# Patient Record
Sex: Male | Born: 1955
Health system: Southern US, Community
[De-identification: ages and names within clinical notes are randomized; demographics above are authoritative.]

## PROBLEM LIST (undated history)

## (undated) DIAGNOSIS — E119 Type 2 diabetes mellitus without complications: Secondary | ICD-10-CM

## (undated) DIAGNOSIS — N189 Chronic kidney disease, unspecified: Secondary | ICD-10-CM

## (undated) DIAGNOSIS — I10 Essential (primary) hypertension: Secondary | ICD-10-CM

## (undated) DIAGNOSIS — E785 Hyperlipidemia, unspecified: Secondary | ICD-10-CM

## (undated) DIAGNOSIS — Z9289 Personal history of other medical treatment: Secondary | ICD-10-CM

## (undated) HISTORY — DX: Essential (primary) hypertension: I10

## (undated) HISTORY — DX: Personal history of other medical treatment: Z92.89

## (undated) HISTORY — DX: Hyperlipidemia, unspecified: E78.5

---

## 2002-02-15 ENCOUNTER — Emergency Department (HOSPITAL_COMMUNITY): Admission: EM | Admit: 2002-02-15 | Discharge: 2002-02-15 | Payer: Self-pay

## 2005-07-11 ENCOUNTER — Encounter: Admission: RE | Admit: 2005-07-11 | Discharge: 2005-07-11 | Payer: Self-pay | Admitting: Emergency Medicine

## 2005-09-19 HISTORY — PX: INGUINAL HERNIA REPAIR: SHX194

## 2006-02-07 ENCOUNTER — Emergency Department (HOSPITAL_COMMUNITY): Admission: EM | Admit: 2006-02-07 | Discharge: 2006-02-07 | Payer: Self-pay | Admitting: Family Medicine

## 2006-04-26 ENCOUNTER — Ambulatory Visit (HOSPITAL_COMMUNITY): Admission: RE | Admit: 2006-04-26 | Discharge: 2006-04-26 | Payer: Self-pay | Admitting: General Surgery

## 2006-08-09 ENCOUNTER — Emergency Department (HOSPITAL_COMMUNITY): Admission: EM | Admit: 2006-08-09 | Discharge: 2006-08-10 | Payer: Self-pay | Admitting: Emergency Medicine

## 2007-05-07 DIAGNOSIS — Z9289 Personal history of other medical treatment: Secondary | ICD-10-CM

## 2007-05-07 HISTORY — DX: Personal history of other medical treatment: Z92.89

## 2010-07-23 ENCOUNTER — Ambulatory Visit: Payer: Self-pay | Admitting: Cardiology

## 2010-07-23 DIAGNOSIS — E785 Hyperlipidemia, unspecified: Secondary | ICD-10-CM

## 2010-07-23 HISTORY — DX: Hyperlipidemia, unspecified: E78.5

## 2011-02-04 NOTE — Op Note (Signed)
NAMEMILLER, LIMEHOUSE NO.:  [id number]   MEDICAL RECORD NO.:  [id number]          PATIENT TYPE:  AMB   LOCATION:  SDS                          FACILITY:  [facility]   PHYSICIAN:  Sharlet Salina T. Hoxworth, M.D.DATE OF BIRTH:  Aug 17, 1956   DATE OF PROCEDURE:  04/26/2006  DATE OF DISCHARGE:  04/26/2006                                 OPERATIVE REPORT   PREOPERATIVE DIAGNOSIS:  Recurrent right inguinal hernia.   POSTOPERATIVE DIAGNOSIS:  Recurrent right inguinal hernia.   SURGICAL PROCEDURES:  Repair of recurrent right inguinal hernia with mesh.   SURGEON:  Lorne Skeens. Hoxworth, M.D.   ANESTHESIA:  General laryngeal mask.   BRIEF HISTORY:  Mr. Chahal is a 55 year old male who, several years ago,  underwent a laparoscopic repair of a right inguinal hernia.  He has noted a  recurrent bulge for some time but in recent months this has gotten larger  and significantly painful with activity.  Examination reveals a chronically  incarcerated mass in the right groin extending down into the upper scrotum.  I have recommended open repair with mesh under general anesthesia.  The  nature of the procedure, indications, risks of bleeding, infection, and  recurrence were discussed and understood.  He is now brought to the  operating room for this procedure.   DESCRIPTION OF PROCEDURE:  The patient was brought to the operating room and  placed in the supine position on the operating table and general laryngeal  mask anesthesia was induced.  He received broad spectrum preoperative  antibiotics.  The right groin was sterilely prepped and draped.  The correct  patient and procedure were verified.  An oblique incision was made in the  groin and dissection carried down through the subcutaneous tissue using the  cautery.  Scarpa's fascia was divided.  Dissection was carried down onto the  external oblique which was cleared down to the inguinal ligament and down to  the external ring.  There  was a good size hernia mass extending down through  the external ring.  The external oblique was divided along the lines of its  fibers through the external ring.  The cord and hernia mass were dissected  up off the floor at the pubic tubercle.  Dissection within the cord revealed  a good sized hernia sac that, with blunt and cautery dissection, was  dissected away from the cord structures up to the level of the internal  ring.  The hernia sac was opened and it contained some chronically  incarcerated fatty tissue which appeared to be probably appendix epiploica  from the right colon.  This was all reduced back into the abdominal cavity.  The one wall of the hernia sac was quite thick with retroperitoneal fat but  there was no viscus in this.  The hernia sac was completely dissected free  from cord up to the level of the internal ring.  At this point, the hernia  sac was divided including the thickened fatty portion which just was  retroperitoneal fat.  It was divided between clamps and tied.  The hernia  sac was then closed with a running silk suture and reduced above the  internal ring.  The ilioinguinal nerve had been protected.  It was replaced  back with the cord structures.  The floor was cleared down to the pubic  tubercle.  A piece Parietex mesh was trimmed to size to fit the floor of the  inguinal canal with tails around the cord at the internal ring.  The mesh  was sutured to the pubic tubercle and then to the inguinal ligament working  medial to lateral with running 2-0 Prolene.  Medially, the mesh was sutured  to the rectus sheath with interrupted 2-0 Prolene.  The tails were then  tacked together lateral to the cord creating a new internal ring snugged to  the fingertip.  The cord and ilioinguinal nerves were returned to their  anatomic position.  All the soft tissue was infiltrated with Marcaine.  Complete hemostasis was assured.  The external oblique was closed over this   with a running 3-0 Vicryl.  Scarpa's fascia was closed with running 3-0  Vicryl.  The skin was closed with running subcuticular 4-0 Monocryl and  Steri-Strips.  Sponge, needle, and instrument counts were correct.  Dry,  sterile dressing was applied and the patient was taken to the recovery room  in good condition.      Lorne Skeens. Hoxworth, M.D.  Electronically Signed     BTH/MEDQ  D:  04/26/2006  T:  04/26/2006  Job:  [id number]

## 2011-02-04 NOTE — Op Note (Signed)
NAMEVIRGIE, [name] NO.:  [id number]   MEDICAL RECORD NO.:  [id number]          PATIENT TYPE:  AMB   LOCATION:  SDS                          FACILITY:  [facility]   PHYSICIAN:  Sharlet Salina T. Hoxworth, M.D.DATE OF BIRTH:  [birth year]   DATE OF PROCEDURE:  04/26/2006  DATE OF DISCHARGE:  04/26/2006                                 OPERATIVE REPORT   PRE AND POSTOPERATIVE DIAGNOSIS:  Recurrent right inguinal hernia.   SURGICAL PROCEDURES:  Open repair of recurrent right inguinal hernia.   SURGEON:  Lorne Skeens. Hoxworth, M.D.   ANESTHESIA:  General.   BRIEF HISTORY:  Christopher Graves is a 55 year old male with history of a  laparoscopic right inguinal hernia repair about 10 years ago.  He has noted  recurrent bulge and discomfort that has gotten gradually worse in recent  years now is giving him significant pain.  Examination confirms a good size  recurrence of right inguinal hernia which was not completely reducible.  I  have recommended proceeding with open repair under general anesthesia.  Risks of bleeding, infection, recurrence were discussed and understood.  He  is now brought to the operating room for this procedure.   DESCRIPTION OF PROCEDURE:  The patient was brought to the operating room and  placed in supine position on operating table and general orotracheal  anesthesia was induced.  The right groin was widely sterilely prepped and  draped.  He was given preoperative antibiotics.  An oblique incision was  made in the right groin dissection carried down through subcutaneous tissue  to the external oblique.  This was divided along the lines of its fibers  through the external ring.  The cord was dissected up off the floor the  pubic tubercle and freed back to the internal ring.  The floor was intact  but there was a good-sized indirect hernia.  There was evidence of mesh up  around the internal ring placed laparoscopically, but a dilated ring with  indirect sac was  found.  Sac was dissected free from cord structures.  It  contained some chronically incarcerated omentum that was freed from the sac  and reduced and the sac was suture-ligated at the internal ring with silk.  A piece of Parietex mesh was trimmed to size to fit for the canal with tails  around the cord at the internal ring.  It was sutured initially at the pubic  tubercle and then to the inguinal ligament with running 2-0 Prolene.  Medially the mesh was sutured to the rectus sheath with interrupted 2-0  Prolene.  Tails were tacked together lateral to cord with interrupted 2-0  Prolene.  The cord was returned to its anatomic position.  The external  oblique was closed over this with running 3-0 Vicryl.  Scarpa fascia was  closed with running 3-0 Vicryl and the skin closed with running 4-0 Monocryl  and Steri-Strips.  Sponge, needle and instrument counts were correct.  The  patient taken recovery in good condition.      Lorne Skeens. Hoxworth, M.D.  Electronically Signed  BTH/MEDQ  D:  05/17/2006  T:  05/18/2006  Job:  [id number]

## 2011-11-30 ENCOUNTER — Encounter: Payer: Self-pay | Admitting: *Deleted

## 2011-11-30 DIAGNOSIS — I10 Essential (primary) hypertension: Secondary | ICD-10-CM

## 2011-11-30 DIAGNOSIS — F419 Anxiety disorder, unspecified: Secondary | ICD-10-CM | POA: Insufficient documentation

## 2011-11-30 DIAGNOSIS — E785 Hyperlipidemia, unspecified: Secondary | ICD-10-CM

## 2012-02-22 ENCOUNTER — Encounter: Payer: Self-pay | Admitting: Cardiology

## 2012-02-23 ENCOUNTER — Encounter: Payer: Self-pay | Admitting: Nurse Practitioner

## 2012-02-23 ENCOUNTER — Encounter: Payer: Self-pay | Admitting: Cardiology

## 2012-07-02 ENCOUNTER — Other Ambulatory Visit: Payer: Self-pay | Admitting: Urology

## 2012-07-02 ENCOUNTER — Encounter (HOSPITAL_COMMUNITY): Payer: Self-pay | Admitting: *Deleted

## 2012-07-02 DIAGNOSIS — N189 Chronic kidney disease, unspecified: Secondary | ICD-10-CM

## 2012-07-02 DIAGNOSIS — E119 Type 2 diabetes mellitus without complications: Secondary | ICD-10-CM

## 2012-07-02 HISTORY — DX: Chronic kidney disease, unspecified: N18.9

## 2012-07-02 HISTORY — PX: HERNIA REPAIR: SHX51

## 2012-07-02 HISTORY — DX: Type 2 diabetes mellitus without complications: E11.9

## 2012-07-02 NOTE — Progress Notes (Signed)
07-02-12 1330 Instructed as SDS labs, instructed on Hibiclens soap as per preparing for surgery guidelines. Will have responsible driver and O53 hrs once discharged. Teach back method used.

## 2012-07-03 ENCOUNTER — Encounter (HOSPITAL_COMMUNITY): Payer: Self-pay | Admitting: Anesthesiology

## 2012-07-03 ENCOUNTER — Encounter (HOSPITAL_COMMUNITY)
Admission: RE | Disposition: A | Discharge status: Home / Self Care | Payer: Self-pay | Source: Ambulatory Visit | Attending: Urology

## 2012-07-03 ENCOUNTER — Ambulatory Visit (HOSPITAL_COMMUNITY)
Admission: RE | Admit: 2012-07-03 | Discharge: 2012-07-03 | Disposition: A | Discharge status: Home / Self Care | Payer: 59 | Source: Ambulatory Visit | Attending: Urology | Admitting: Urology

## 2012-07-03 ENCOUNTER — Ambulatory Visit (HOSPITAL_COMMUNITY): Payer: 59 | Admitting: Anesthesiology

## 2012-07-03 ENCOUNTER — Encounter (HOSPITAL_COMMUNITY): Payer: Self-pay | Admitting: *Deleted

## 2012-07-03 ENCOUNTER — Ambulatory Visit (HOSPITAL_COMMUNITY): Payer: 59

## 2012-07-03 DIAGNOSIS — I10 Essential (primary) hypertension: Secondary | ICD-10-CM | POA: Insufficient documentation

## 2012-07-03 DIAGNOSIS — N486 Induration penis plastica: Secondary | ICD-10-CM | POA: Insufficient documentation

## 2012-07-03 DIAGNOSIS — Z7982 Long term (current) use of aspirin: Secondary | ICD-10-CM | POA: Insufficient documentation

## 2012-07-03 DIAGNOSIS — Z79899 Other long term (current) drug therapy: Secondary | ICD-10-CM | POA: Insufficient documentation

## 2012-07-03 DIAGNOSIS — Z87442 Personal history of urinary calculi: Secondary | ICD-10-CM | POA: Insufficient documentation

## 2012-07-03 DIAGNOSIS — E7800 Pure hypercholesterolemia, unspecified: Secondary | ICD-10-CM | POA: Insufficient documentation

## 2012-07-03 DIAGNOSIS — R3129 Other microscopic hematuria: Secondary | ICD-10-CM | POA: Insufficient documentation

## 2012-07-03 DIAGNOSIS — N132 Hydronephrosis with renal and ureteral calculous obstruction: Secondary | ICD-10-CM

## 2012-07-03 DIAGNOSIS — E119 Type 2 diabetes mellitus without complications: Secondary | ICD-10-CM | POA: Insufficient documentation

## 2012-07-03 DIAGNOSIS — N201 Calculus of ureter: Secondary | ICD-10-CM | POA: Insufficient documentation

## 2012-07-03 HISTORY — DX: Type 2 diabetes mellitus without complications: E11.9

## 2012-07-03 HISTORY — DX: Chronic kidney disease, unspecified: N18.9

## 2012-07-03 HISTORY — PX: CYSTOSCOPY/RETROGRADE/URETEROSCOPY/STONE EXTRACTION WITH BASKET: SHX5317

## 2012-07-03 LAB — BASIC METABOLIC PANEL
CO2: 29 mEq/L (ref 19–32)
Calcium: 9.4 mg/dL (ref 8.4–10.5)
GFR calc non Af Amer: >90 mL/min (ref >90)
Glucose, Bld: 96 mg/dL (ref 70–99)
Potassium: 4.5 mEq/L (ref 3.5–5.1)
Sodium: 139 mEq/L (ref 135–145)

## 2012-07-03 LAB — SURGICAL PCR SCREEN
MRSA, PCR: NEGATIVE
Staphylococcus aureus: POSITIVE — AB

## 2012-07-03 LAB — GLUCOSE, CAPILLARY: Glucose-Capillary: 89 mg/dL (ref 70–99)

## 2012-07-03 SURGERY — CYSTOSCOPY, WITH CALCULUS REMOVAL USING BASKET
Anesthesia: General | Site: Ureter | Laterality: Left | Wound class: Clean Contaminated

## 2012-07-03 MED ORDER — PROMETHAZINE HCL 25 MG/ML IJ SOLN: 6.2500 mg | INTRAMUSCULAR | Status: DC | PRN

## 2012-07-03 MED ORDER — BELLADONNA ALKALOIDS-OPIUM 16.2-60 MG RE SUPP
RECTAL | Status: AC
  Filled 2012-07-03: qty 1

## 2012-07-03 MED ORDER — TRIMETHOPRIM 100 MG PO TABS: 100.0000 mg | ORAL_TABLET | ORAL | Status: DC

## 2012-07-03 MED ORDER — LACTATED RINGERS IV SOLN
INTRAVENOUS | Status: DC
  Administered 2012-07-03: 22:00:00 via INTRAVENOUS

## 2012-07-03 MED ORDER — MEPERIDINE HCL 50 MG/ML IJ SOLN: 6.2500 mg | INTRAMUSCULAR | Status: DC | PRN

## 2012-07-03 MED ORDER — ACETAMINOPHEN 10 MG/ML IV SOLN
INTRAVENOUS | Status: AC
  Filled 2012-07-03: qty 100

## 2012-07-03 MED ORDER — LIDOCAINE HCL 2 % EX GEL
CUTANEOUS | Status: DC | PRN
  Administered 2012-07-03: 1 via URETHRAL

## 2012-07-03 MED ORDER — OXYCODONE HCL 5 MG PO TABS: 5.0000 mg | ORAL_TABLET | Freq: Once | ORAL | Status: DC | PRN

## 2012-07-03 MED ORDER — ACETAMINOPHEN 10 MG/ML IV SOLN
INTRAVENOUS | Status: DC | PRN
  Administered 2012-07-03: 1000 mg via INTRAVENOUS

## 2012-07-03 MED ORDER — IOHEXOL 300 MG/ML  SOLN
INTRAMUSCULAR | Status: DC | PRN
  Administered 2012-07-03: 5 mL

## 2012-07-03 MED ORDER — OXYCODONE HCL 5 MG/5ML PO SOLN
5.0000 mg | Freq: Once | ORAL | Status: DC | PRN
Start: 2012-07-03 — End: 2012-07-04

## 2012-07-03 MED ORDER — LIDOCAINE HCL (CARDIAC) 20 MG/ML IV SOLN
INTRAVENOUS | Status: DC | PRN
  Administered 2012-07-03: 80 mg via INTRAVENOUS

## 2012-07-03 MED ORDER — MIDAZOLAM HCL 5 MG/5ML IJ SOLN
INTRAMUSCULAR | Status: DC | PRN
  Administered 2012-07-03: 2 mg via INTRAVENOUS

## 2012-07-03 MED ORDER — HYDROMORPHONE HCL PF 1 MG/ML IJ SOLN: 0.2500 mg | INTRAMUSCULAR | Status: DC | PRN

## 2012-07-03 MED ORDER — ONDANSETRON HCL 4 MG/2ML IJ SOLN
INTRAMUSCULAR | Status: DC | PRN
  Administered 2012-07-03: 4 mg via INTRAVENOUS

## 2012-07-03 MED ORDER — LIDOCAINE HCL 2 % EX GEL
CUTANEOUS | Status: AC
  Filled 2012-07-03: qty 10

## 2012-07-03 MED ORDER — 0.9 % SODIUM CHLORIDE (POUR BTL) OPTIME
TOPICAL | Status: DC | PRN
  Administered 2012-07-03: 1000 mL

## 2012-07-03 MED ORDER — MUPIROCIN 2 % EX OINT
TOPICAL_OINTMENT | Freq: Two times a day (BID) | CUTANEOUS | Status: DC
  Administered 2012-07-03: 1 via NASAL
  Filled 2012-07-03: qty 22

## 2012-07-03 MED ORDER — SODIUM CHLORIDE 0.9 % IR SOLN
Status: DC | PRN
  Administered 2012-07-03: 6000 mL

## 2012-07-03 MED ORDER — HYDROCODONE-ACETAMINOPHEN 7.5-650 MG PO TABS: 1.0000 | ORAL_TABLET | Freq: Four times a day (QID) | ORAL | Status: DC | PRN

## 2012-07-03 MED ORDER — CEFAZOLIN SODIUM-DEXTROSE 2-3 GM-% IV SOLR
2.0000 g | INTRAVENOUS | Status: AC
  Administered 2012-07-03: 2 g via INTRAVENOUS

## 2012-07-03 MED ORDER — PROPOFOL 10 MG/ML IV BOLUS
INTRAVENOUS | Status: DC | PRN
  Administered 2012-07-03: 180 mg via INTRAVENOUS

## 2012-07-03 MED ORDER — IOHEXOL 300 MG/ML  SOLN
INTRAMUSCULAR | Status: AC
  Filled 2012-07-03: qty 1

## 2012-07-03 MED ORDER — URELLE 81 MG PO TABS: 1.0000 | ORAL_TABLET | Freq: Three times a day (TID) | ORAL | Status: DC

## 2012-07-03 MED ORDER — FENTANYL CITRATE 0.05 MG/ML IJ SOLN
INTRAMUSCULAR | Status: DC | PRN
  Administered 2012-07-03 (×2): 50 ug via INTRAVENOUS

## 2012-07-03 MED ORDER — BELLADONNA ALKALOIDS-OPIUM 16.2-60 MG RE SUPP
RECTAL | Status: DC | PRN
  Administered 2012-07-03: 1 via RECTAL

## 2012-07-03 MED ORDER — KETOROLAC TROMETHAMINE 30 MG/ML IJ SOLN
INTRAMUSCULAR | Status: DC | PRN
  Administered 2012-07-03: 30 mg via INTRAVENOUS

## 2012-07-03 MED ORDER — LACTATED RINGERS IV SOLN
INTRAVENOUS | Status: DC | PRN
  Administered 2012-07-03 (×2): via INTRAVENOUS

## 2012-07-03 MED ORDER — CEFAZOLIN SODIUM-DEXTROSE 2-3 GM-% IV SOLR
INTRAVENOUS | Status: AC
  Filled 2012-07-03: qty 50

## 2012-07-03 SURGICAL SUPPLY — 23 items
ADAPTER CATH URET PLST 4-6FR (CATHETERS) ×2 IMPLANT
ADPR CATH URET STRL DISP 4-6FR (CATHETERS) ×1
BAG URO CATCHER STRL LF (DRAPE) ×2 IMPLANT
BASKET ZERO TIP NITINOL 2.4FR (BASKET) ×1 IMPLANT
BSKT STON RTRVL ZERO TP 2.4FR (BASKET) ×1
CATH INTERMIT  6FR 70CM (CATHETERS) ×2 IMPLANT
CATH URET 5FR 28IN OPEN ENDED (CATHETERS) ×1 IMPLANT
CLOTH BEACON ORANGE TIMEOUT ST (SAFETY) ×2 IMPLANT
DRAPE CAMERA CLOSED 9X96 (DRAPES) ×2 IMPLANT
FIBER LASER TRAC TIP (UROLOGICAL SUPPLIES) ×1 IMPLANT
GLOVE BIOGEL M STRL SZ7.5 (GLOVE) ×2 IMPLANT
GLOVE SURG SS PI 8.0 STRL IVOR (GLOVE) ×2 IMPLANT
GOWN PREVENTION PLUS XLARGE (GOWN DISPOSABLE) ×2 IMPLANT
GOWN STRL NON-REIN LRG LVL3 (GOWN DISPOSABLE) ×2 IMPLANT
GOWN STRL REIN XL XLG (GOWN DISPOSABLE) ×2 IMPLANT
GUIDEWIRE STR DUAL SENSOR (WIRE) ×2 IMPLANT
MANIFOLD NEPTUNE II (INSTRUMENTS) ×2 IMPLANT
MARKER SKIN DUAL TIP RULER LAB (MISCELLANEOUS) ×2 IMPLANT
PACK CYSTO (CUSTOM PROCEDURE TRAY) ×2 IMPLANT
SCRUB PCMX 4 OZ (MISCELLANEOUS) ×2 IMPLANT
STENT CONTOUR 6FRX24X.038 (STENTS) ×1 IMPLANT
SYRINGE IRR TOOMEY STRL 70CC (SYRINGE) ×1 IMPLANT
TUBING CONNECTING 10 (TUBING) ×2 IMPLANT

## 2012-07-03 NOTE — Anesthesia Preprocedure Evaluation (Addendum)
Anesthesia Evaluation  Patient identified by MRN, date of birth, ID band Patient awake    Reviewed: Allergy & Precautions, H&P , NPO status , Patient's Chart, lab work & pertinent test results  Airway Mallampati: II TM Distance: >3 FB Neck ROM: Full    Dental  (+) Dental Advisory Given and Teeth Intact   Pulmonary former smoker,  breath sounds clear to auscultation  Pulmonary exam normal       Cardiovascular hypertension, Pt. on medications Rhythm:Regular Rate:Normal     Neuro/Psych Anxiety negative neurological ROS  negative psych ROS   GI/Hepatic negative GI ROS, Neg liver ROS,   Endo/Other  diabetes, Type 2  Renal/GU Renal diseasenegative Renal ROS     Musculoskeletal negative musculoskeletal ROS (+)   Abdominal   Peds  Hematology negative hematology ROS (+)   Anesthesia Other Findings   Reproductive/Obstetrics                         Anesthesia Physical Anesthesia Plan  ASA: II  Anesthesia Plan: General   Post-op Pain Management:    Induction: Intravenous  Airway Management Planned: LMA  Additional Equipment:   Intra-op Plan:   Post-operative Plan: Extubation in OR  Informed Consent: I have reviewed the patients History and Physical, chart, labs and discussed the procedure including the risks, benefits and alternatives for the proposed anesthesia with the patient or authorized representative who has indicated his/her understanding and acceptance.   Dental advisory given  Plan Discussed with:   Anesthesia Plan Comments:        Anesthesia Quick Evaluation

## 2012-07-03 NOTE — Interval H&P Note (Signed)
History and Physical Interval Note:  07/03/2012 7:29 PM  Christopher Graves  has presented today for surgery, with the diagnosis of left ureteral stone   The various methods of treatment have been discussed with the patient and family. After consideration of risks, benefits and other options for treatment, the patient has consented to  Procedure(s) (LRB) with comments: CYSTOSCOPY/RETROGRADE/URETEROSCOPY/STONE EXTRACTION WITH BASKET (Left) - CYSTOSCOPY LEFT RETROGRADE PYELOGRAM LEFT URETEROSCOPY WITH HOLMIUM LASER ,BASKET EXTRACTION LEFT DOUBLE J STENT  HOLMIUM LASER APPLICATION (Left) as a surgical intervention .  The patient's history has been reviewed, patient examined, no change in status, stable for surgery.  I have reviewed the patient's chart and labs.  Questions were answered to the patient's satisfaction.     Jethro Bolus I

## 2012-07-03 NOTE — Anesthesia Postprocedure Evaluation (Signed)
Anesthesia Post Note  Patient: Christopher Graves  Procedure(s) Performed: Procedure(s) (LRB): CYSTOSCOPY/RETROGRADE/URETEROSCOPY/STONE EXTRACTION WITH BASKET (Left) HOLMIUM LASER APPLICATION (Left)  Anesthesia type: General  Patient location: PACU  Post pain: Pain level controlled  Post assessment: Post-op Vital signs reviewed  Last Vitals: BP 139/80  Pulse 57  Temp 36.8 C (Oral)  Resp 18  Ht 5\' 11"  (1.803 m)  Wt 185 lb (83.915 kg)  BMI 25.80 kg/m2  SpO2 98%  Post vital signs: Reviewed  Level of consciousness: sedated  Complications: No apparent anesthesia complications

## 2012-07-03 NOTE — Op Note (Signed)
Pre-operative diagnosis : Impacted Left midureteral calculus  Postoperative diagnosis: Same  Operation: Cystourethroscopy, left retrograde pyelogram with interpretation, left ureteroscopy, laser fragmentation of elongation 1.5 cm left mid ureteral stone, basket extraction of stone fragments, insertion of left double-J stent, 6 French by 24 cm.  Surgeon:  Kathie Rhodes. Patsi Sears, MD  First assistant: None  Anesthesia  [id number]}  Preparation: After appropriate preanesthesia, the patient was brought to the operating room, placed on the operating table in the dorsal supine position where general LMA anesthesia was introduced. He was then replaced in the dorsal lithotomy position where the pubis was prepped with Betadine solution, and draped in usual fashion. The left arm was previously marked. The arm band was double checked.  Review history:Problems  1. Flank Pain Left  2. Microscopic Hematuria 599.72  3. Mid Ureteral Stone On The Left 592.1  4. History of Nephrolithiasis V13.01  5. Peyronie's Disease 607.85  History of Present Illness  56 yo Type II diet- controlled diabetic married male, returns today for a 1 week f/u, KUB & to discuss surgery for hx of a 1.4cm Lt mid ureteral stone. He thinks that he might have passed a fragment of the stone. Hx of Lt flank pain & having difficulty voiding (decrease force of urinary steam). He has a hx of kidney stones. (several years ago-not recovered). No nausea or vomiting. + flatus. No gross hematuria. Pt drinks water, 2 glasses/day, with apple cider vinegar (1 glass/day).      Statement of  Likelihood of Success: Excellent. TIME-OUT observed.:  Procedure: Cystourethroscopy was accomplished which showed enlarged prostate with elevated median lobe. The trigone was normal, and clear reflux was seen from the right ureteral orifice. No urine flow was noted from the left ureteral orifice, however. Left retropyelogram was performed, which showed faint stone  outlined in the left mid ureter, which approximated the 1.5 cm stone noted on CT previously acquired CT scan. A guidewire was passed around the stone into the renal pelvis. Following this, the short semiflexed will ureteroscope was passed into the ureteral orifice, and into the lower ureter. In the mid ureter, a 1.5 cm stone was identified. The distal portion of stone was grown into the ureteral wall. The stone required laser vaporization and fragmentation. Following fragmentation using the 200  fiber, with 0.5/5 settings, stone fragments removed with basket extraction. Following this, tiny stone flakes were identified within the ureter. No large stone pieces were seen, however. I elected then to leave a double-J stent, and a 6 French by 24 semiurgent stent was selected, placed over the guidewire and coiled in the renal pelvis, and in the bladder without difficulty.  The patient received IV Tylenol at the beginning of the procedure, and IV Toradol at the end of the procedure. Following drainage of the bladder, 10 cc of Xylocaine gel were placed in the ureter. The patient was awakened, and taken to recovery room in good condition.

## 2012-07-03 NOTE — Progress Notes (Signed)
Family informed that surgery delayed by one hour. They verbalize understanding.

## 2012-07-03 NOTE — H&P (Signed)
cc: Dr. Paulino Rily   Reason For Visit     1 week f/u, KUB & discuss surgery   Active Problems Problems  1. Flank Pain Left 2. Microscopic Hematuria 599.72 3. Mid Ureteral Stone On The Left 592.1 4. History of  Nephrolithiasis V13.01 5. Peyronie's Disease 607.Christopher  History of Present Illness           56 yo Type II diet- controlled diabetic married Graves, returns today for a 1 week f/u, KUB & to discuss surgery for hx of a 1.4cm Lt mid ureteral stone.  He thinks that he might have passed a fragment of the stone.  Hx of Lt flank pain & having difficulty voiding (decrease force of urinary steam).  He has a hx of kidney stones. (several years ago-not recovered). No nausea or vomiting. + flatus. No gross hematuria. Pt drinks water, 2 glasses/day, with apple cider vinegar (1 glass/day).     In addition, he complains of an "indentation" of his erect penis, leaning to the left, 45 degrees, and he is still able to make penetration. He has good sensation at the tip end of the penis, although the glans does not get as firm as it used to.   Past Medical History Problems  1. History of  Diabetes Mellitus 250.00 2. Former Smoker V15.82 3. History of  Heartburn 787.1 4. History of  Hypercholesterolemia 272.0 5. History of  Hypertension 401.9 6. History of  Nephrolithiasis V13.01  Surgical History Problems  1. History of  Hernia Repair  Current Meds 1. Aspirin TABS; Therapy: (Recorded:01Oct2013) to 2. Cinnamon CAPS; Therapy: (Recorded:01Oct2013) to 3. Green Coffee Bean CAPS; Therapy: (Recorded:01Oct2013) to 4. Hydrocodone-Acetaminophen 7.5-750 MG Oral Tablet; Therapy: (Recorded:01Oct2013) to 5. Losartan Potassium 100 MG Oral Tablet; Therapy: (Recorded:01Oct2013) to 6. Multi Vitamin/Minerals TABS; Therapy: (Recorded:01Oct2013) to 7. Sprix 15.75 MG/SPRAY Nasal Solution; INSTILL 1 SQUIRT Every 8 hours; Therapy: 01Oct2013 to  (Last Rx:01Oct2013) 8. Vitamin B-12 TABS; Therapy: (Recorded:01Oct2013)  to 9. Zoloft 100 MG Oral Tablet; Therapy: (Recorded:01Oct2013) to  Allergies Medication  1. No Known Drug Allergies  Family History Problems  1. Family history of  Family Health Status Number Of Children 1 son, 1 daughter 2. Family history of  Father Deceased At Age 23  Social History Problems  1. Caffeine Use 2 cups 2. Marital History - Currently Married Denied  3. History of  Alcohol Use  Review of Systems Genitourinary, constitutional, skin, eye, otolaryngeal, hematologic/lymphatic, cardiovascular, pulmonary, endocrine, musculoskeletal, gastrointestinal, neurological and psychiatric system(s) were reviewed and pertinent findings if present are noted.  Genitourinary: urinary stream starts and stops.  Gastrointestinal: flank pain, abdominal pain and constipation.  Constitutional: night sweats.  Musculoskeletal: back pain.    Vitals Vital Signs [Data Includes: Last 1 Day]  07Oct2013 09:52AM  Blood Pressure: 152 / 93 Temperature: 98.2 F Heart Rate: 69  Physical Exam Abdomen: The abdomen is soft and nontender. No masses are palpated. No CVA tenderness. No hernias are palpable. No hepatosplenomegaly noted.    Results/Data  Selected Results  KUB 07Oct2013 12:00AM Jethro Bolus   Test Name Result Flag Reference  Diagnostic Images Are Available In PACS For This Exam.   AU CT-STONE PROTOCOL 01Oct2013 12:00AM Jethro Bolus   Test Name Result Flag Reference  ** RADIOLOGY REPORT BY [facility] **   *RADIOLOGY REPORT*  Clinical Data: Left flank pain.  CT ABDOMEN AND PELVIS WITHOUT CONTRAST (URINARY CALCULUS PROTOCOL)  Technique: Multidetector CT imaging was performed through the abdomen and pelvis without intravenous contrast  to include the urinary tract.  Comparison: 08/09/2006  Findings: Visualized lung bases are clear. There is no evidence for free intraperitoneal air.  There is a left perinephric edema and moderate  left hydroureteronephrosis. There is an elongated calcification in the mid left ureter measuring up to 1.4 cm. There are no other kidney stones. No evidence for right hydronephrosis.  The liver and spleen are not completely visualized. Visualized aspects of the liver, gallbladder, spleen, stomach and pancreas are within normal limits. Normal appearance of the right adrenal gland. There is a nodule involving the medial limb of the left adrenal gland that measures up to 1.7 cm and Hounsfield units measure negative 1.75. This nodule is unchanged and compatible with a benign adenoma. There is a stable smaller nodule in the lateral limb of the left adrenal gland. Stable stranding in the right inguinal canal consistent with previous surgery.  Calcifications involving the prostate. No gross abnormality to the urinary bladder or seminal vesicles. There is colonic diverticulosis without acute inflammation. Normal appearance of the appendix. Degenerative disc disease at L4-L5 and L5-S1.  IMPRESSION: Moderate left hydroureteronephrosis due to a large stone in the mid left ureter. The stone measures up to 1.4 cm in length.  Stable left adrenal nodules.  These results will be called to the ordering clinician or representative by the Radiologist Assistant, and communication documented in the PACS Dashboard.   Original Report Authenticated By: Richarda Overlie, M.D.    19 Jun 2012 12:00 AM   AU CT-STONE PROTOCOL       CT-STONE PROTOCOL ** RADIOLOGY REPORT BY [facility] **     *RADIOLOGY REPORT*    Clinical Data: Left flank pain.    CT ABDOMEN AND PELVIS WITHOUT CONTRAST (URINARY CALCULUS PROTOCOL)    Technique:  Multidetector CT imaging was performed through the  abdomen a   Assessment Assessed  1. Mid Ureteral Stone On The Left 592.1 2. Flank Pain Left   Reviewed KUB today and CT from last week. Al;though lithottripsy is option, ureteroscopy will give direct fision of area  to answer why stone stopped in this particular area of the ureter ( straight protion. It is a long stone ( 1.5cm, but is only 5mm in diameter). He will RTC for pain med if he has colic, or may go to [facility].   Plan Mid Ureteral Stone On The Left (592.1)  1. Hydrocodone-Acetaminophen 5-325 MG Oral Tablet; TAKE 1 TABLET EVERY 6 HOURS AS  NEEDED; Therapy: 07Oct2013 to (Evaluate:11Oct2013); Last Rx:07Oct2013   Pt for Add-on surgery [facility] op. Pt has piece of stone and we will add to fragments which we anticip[ate we will get next week.  Will give pain med at home. He has Sprix at home.   Signatures Electronically signed by : Jethro Bolus, M.D.; Jun 25 2012 11:05AM

## 2012-07-03 NOTE — Transfer of Care (Signed)
Immediate Anesthesia Transfer of Care Note  Patient: Christopher Graves  Procedure(s) Performed: Procedure(s) (LRB) with comments: CYSTOSCOPY/RETROGRADE/URETEROSCOPY/STONE EXTRACTION WITH BASKET (Left) - CYSTOSCOPY LEFT RETROGRADE PYELOGRAM LEFT URETEROSCOPY WITH HOLMIUM LASER ,BASKET EXTRACTION LEFT DOUBLE J STENT  HOLMIUM LASER APPLICATION (Left)  Patient Location: PACU  Anesthesia Type: General  Level of Consciousness: awake, alert  and oriented  Airway & Oxygen Therapy: Patient Spontanous Breathing and Patient connected to face mask oxygen  Post-op Assessment: Report given to PACU RN and Post -op Vital signs reviewed and stable  Post vital signs: Reviewed and stable  Complications: No apparent anesthesia complications

## 2012-07-04 ENCOUNTER — Encounter (HOSPITAL_COMMUNITY): Payer: Self-pay | Admitting: Urology

## 2012-10-19 ENCOUNTER — Other Ambulatory Visit: Payer: Self-pay | Admitting: Family Medicine

## 2012-10-19 ENCOUNTER — Ambulatory Visit
Admission: RE | Admit: 2012-10-19 | Discharge: 2012-10-19 | Disposition: A | Discharge status: Home / Self Care | Payer: 59 | Source: Ambulatory Visit | Attending: Family Medicine | Admitting: Family Medicine

## 2012-10-19 ENCOUNTER — Telehealth: Payer: Self-pay | Admitting: Cardiology

## 2012-10-19 DIAGNOSIS — M79604 Pain in right leg: Secondary | ICD-10-CM

## 2012-10-19 NOTE — Telephone Encounter (Signed)
Called wife and patient actually has a 9:30 appointment with PCP.  Wife did want to get in for follow up with someone in the practice.  Will forward to Dawayne Patricia to see if she would be willing to see patient (last seen 2001).

## 2012-10-19 NOTE — Telephone Encounter (Signed)
Can we get his paper chart and let me review?

## 2012-10-19 NOTE — Telephone Encounter (Signed)
New problem:   Previous patient of Dr. Deborah Chalk.    Wife calling stating C/O thinks but not for sure  left leg  Blood clot .  Feel like a raise area. Patient started to go to [facility] urgent care this am - open up at 10 am.

## 2012-10-19 NOTE — Telephone Encounter (Signed)
Discussed further with Lawson Fiscal and patient last seen 2011, ok to see for ov. Did request paper chart

## 2012-10-22 ENCOUNTER — Ambulatory Visit: Payer: 59 | Admitting: Nurse Practitioner

## 2016-11-10 DIAGNOSIS — E7800 Pure hypercholesterolemia, unspecified: Secondary | ICD-10-CM | POA: Diagnosis not present

## 2017-06-27 DIAGNOSIS — E785 Hyperlipidemia, unspecified: Secondary | ICD-10-CM | POA: Diagnosis not present

## 2017-06-27 DIAGNOSIS — Z0000 Encounter for general adult medical examination without abnormal findings: Secondary | ICD-10-CM | POA: Diagnosis not present

## 2017-06-27 DIAGNOSIS — E1122 Type 2 diabetes mellitus with diabetic chronic kidney disease: Secondary | ICD-10-CM | POA: Diagnosis not present

## 2017-06-27 DIAGNOSIS — Z79899 Other long term (current) drug therapy: Secondary | ICD-10-CM | POA: Diagnosis not present

## 2018-01-24 DIAGNOSIS — N200 Calculus of kidney: Secondary | ICD-10-CM | POA: Diagnosis not present

## 2018-01-24 DIAGNOSIS — R310 Gross hematuria: Secondary | ICD-10-CM | POA: Diagnosis not present

## 2018-01-31 DIAGNOSIS — R310 Gross hematuria: Secondary | ICD-10-CM | POA: Diagnosis not present

## 2018-01-31 DIAGNOSIS — N280 Ischemia and infarction of kidney: Secondary | ICD-10-CM | POA: Diagnosis not present

## 2018-01-31 DIAGNOSIS — N281 Cyst of kidney, acquired: Secondary | ICD-10-CM | POA: Diagnosis not present

## 2018-02-14 ENCOUNTER — Other Ambulatory Visit (HOSPITAL_COMMUNITY): Payer: Self-pay | Admitting: Urology

## 2018-02-14 DIAGNOSIS — N200 Calculus of kidney: Secondary | ICD-10-CM | POA: Diagnosis not present

## 2018-02-14 DIAGNOSIS — D49511 Neoplasm of unspecified behavior of right kidney: Secondary | ICD-10-CM

## 2018-02-14 DIAGNOSIS — R310 Gross hematuria: Secondary | ICD-10-CM | POA: Diagnosis not present

## 2018-02-19 ENCOUNTER — Ambulatory Visit (HOSPITAL_COMMUNITY)
Admission: RE | Admit: 2018-02-19 | Discharge: 2018-02-19 | Disposition: A | Discharge status: Home / Self Care | Payer: 59 | Source: Ambulatory Visit | Attending: Urology | Admitting: Urology

## 2018-02-19 DIAGNOSIS — N2889 Other specified disorders of kidney and ureter: Secondary | ICD-10-CM | POA: Insufficient documentation

## 2018-02-19 DIAGNOSIS — N281 Cyst of kidney, acquired: Secondary | ICD-10-CM | POA: Diagnosis not present

## 2018-02-19 DIAGNOSIS — D49511 Neoplasm of unspecified behavior of right kidney: Secondary | ICD-10-CM | POA: Diagnosis not present

## 2018-02-19 MED ORDER — GADOBENATE DIMEGLUMINE 529 MG/ML IV SOLN
20.0000 mL | Freq: Once | INTRAVENOUS | Status: AC | PRN
  Administered 2018-02-19: 20 mL via INTRAVENOUS

## 2018-08-07 DIAGNOSIS — Z0000 Encounter for general adult medical examination without abnormal findings: Secondary | ICD-10-CM | POA: Diagnosis not present

## 2018-08-07 DIAGNOSIS — I10 Essential (primary) hypertension: Secondary | ICD-10-CM | POA: Diagnosis not present

## 2018-08-07 DIAGNOSIS — E1122 Type 2 diabetes mellitus with diabetic chronic kidney disease: Secondary | ICD-10-CM | POA: Diagnosis not present

## 2018-10-07 IMAGING — MR MR ABDOMEN WO/W CM
9 of 18 series · 21 of 48 positions shown · IV contrast (multihance)
Comparison: CT 01/31/2018

CLINICAL DATA: Indeterminate renal lesions on CT. MRI recommended
for further evaluation.

EXAM:
MRI ABDOMEN WITHOUT AND WITH CONTRAST
TECHNIQUE: Multiplanar multisequence MR imaging of the abdomen was performed
both before and after the administration of intravenous contrast.
CONTRAST:  20mL MULTIHANCE GADOBENATE DIMEGLUMINE 529 MG/ML IV SOLN

[Series 4: T2 fat-sat · axial · 5.0mm · 0.78mm/px · z∈[+45,+290]mm · 3 of 50 slices shown]
[im 1/50]
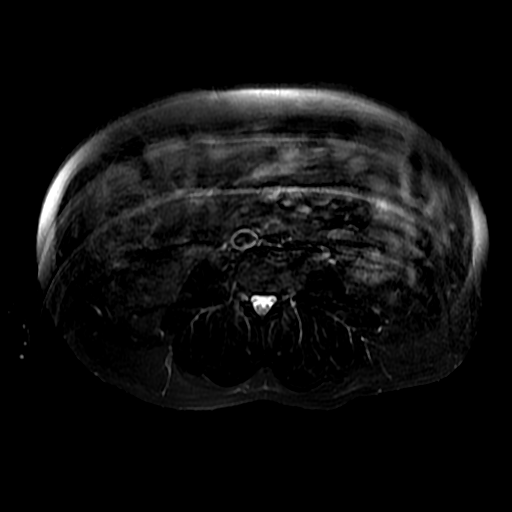
[im 25/50]
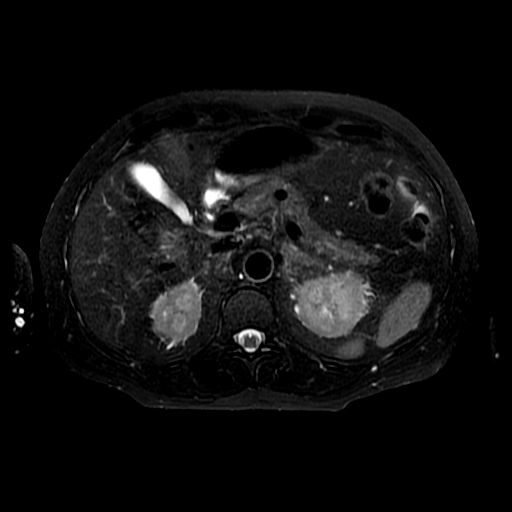
[im 50/50]
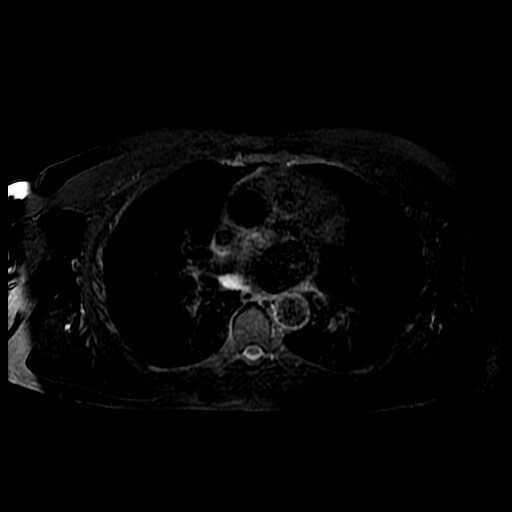

[Series 5: DWI b500 · axial · 6.0mm · 1.48mm/px · z∈[+29,+287]mm · 3 of 68 slices shown]
[im 1/68]
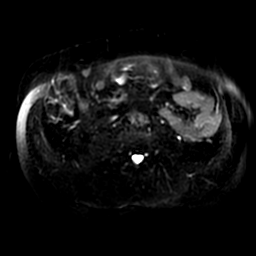
[im 34/68]
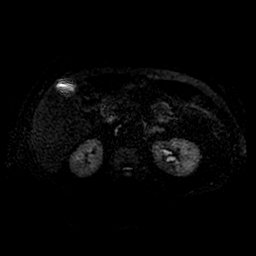
[im 68/68]
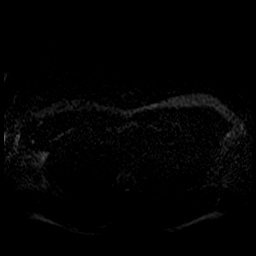

[Series 7: T2 · coronal · 5.0mm · 0.78mm/px · 1 of 48 slices shown (1 of 2)]
[im 1/48]
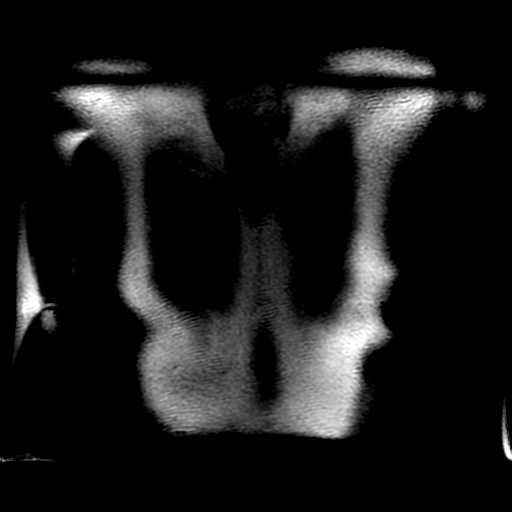

[Series 8: bSSFP · axial · 5.0mm · 0.78mm/px · z∈[+28,+288]mm · 2 of 53 slices shown]
[im 1/53]
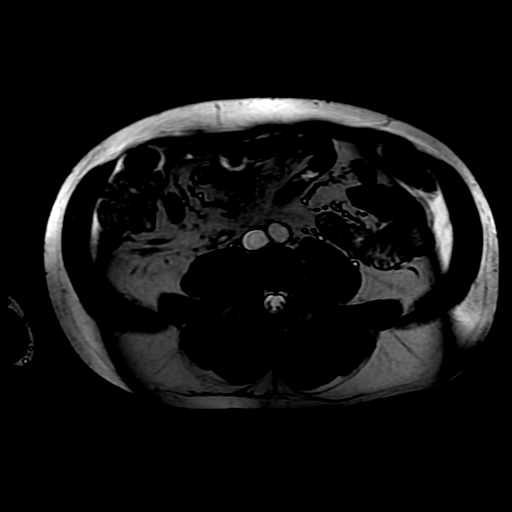
[im 53/53]
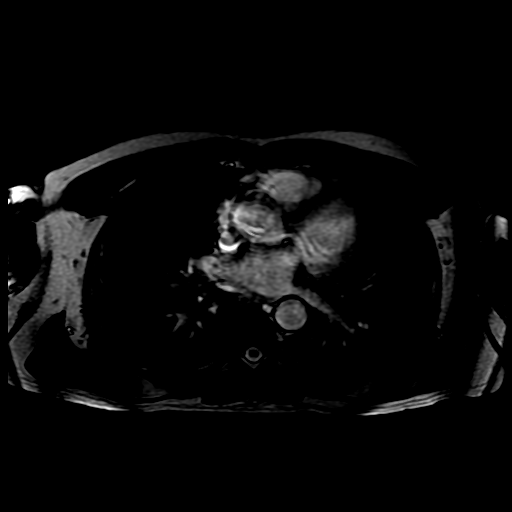

[Series 10: T2 · axial · 5.0mm · 0.78mm/px · z∈[+28,+288]mm · 2 of 53 slices shown (2 of 2)]
[im 1/53]
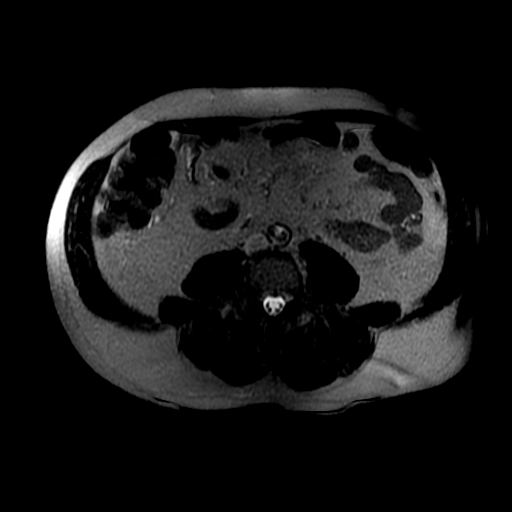
[im 53/53]
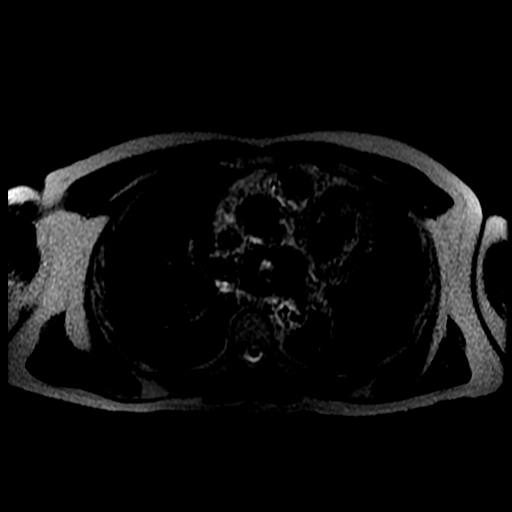

[Series 11: ax dualecho · axial · 5.0mm · 0.78mm/px · z∈[+28,+288]mm · 3 of 106 slices shown]
[im 1/106]
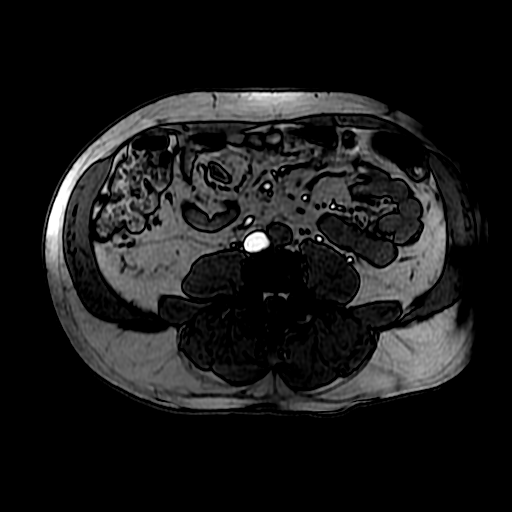
[im 53/106]
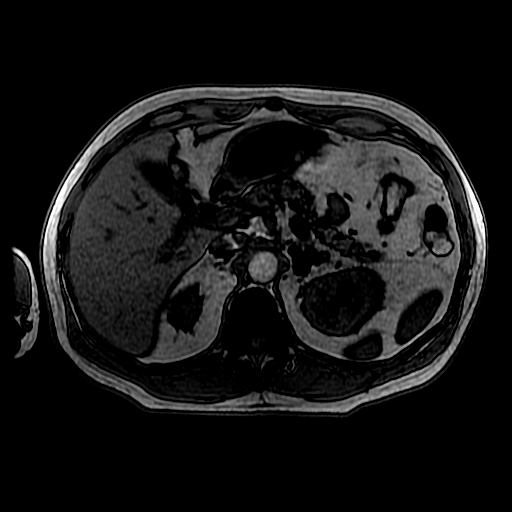
[im 106/106]
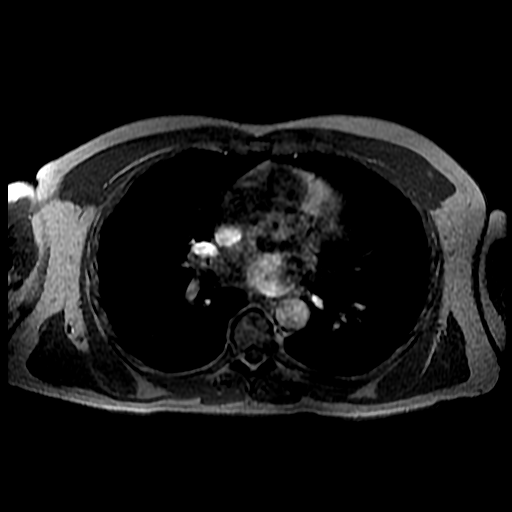

[Series 500: DWI · axial · 6.0mm · 1.48mm/px · 1 of 34 slices shown]
[im 1/34]
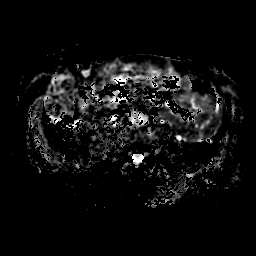

[Series 1200: T1 dynamic · axial · 5.0mm · 0.78mm/px · z∈[+39,+297]mm · 3 of 104 slices shown (1 of 2)]
[im 1/104]
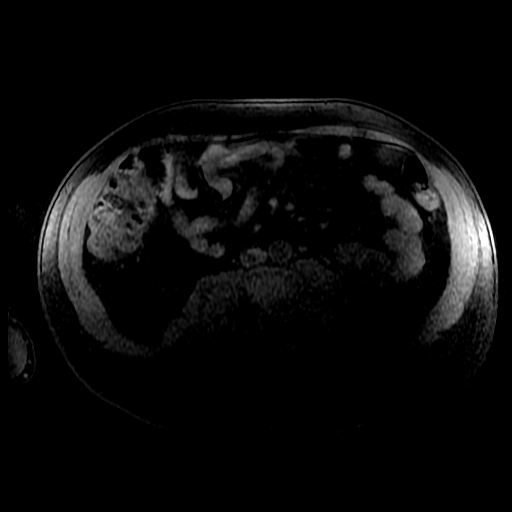
[im 52/104]
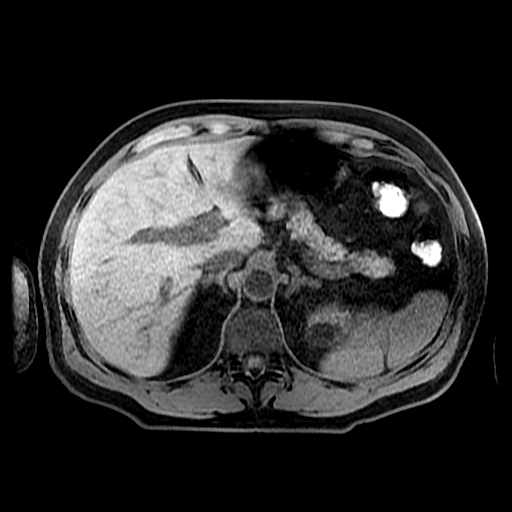
[im 104/104]
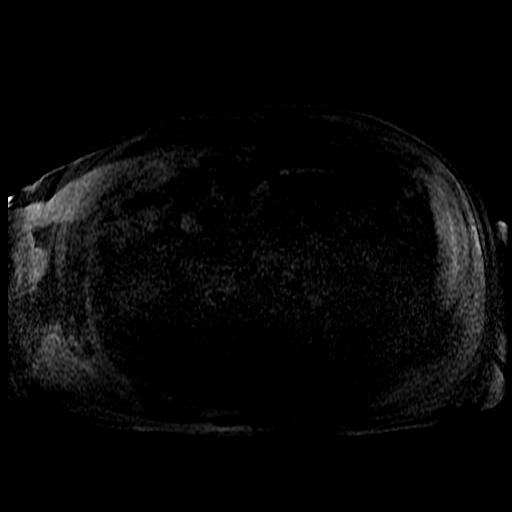

[Series 1201: T1 dynamic · axial · 5.0mm · 0.78mm/px · z∈[+39,+297]mm · 3 of 104 slices shown (2 of 2)]
[im 1/104]
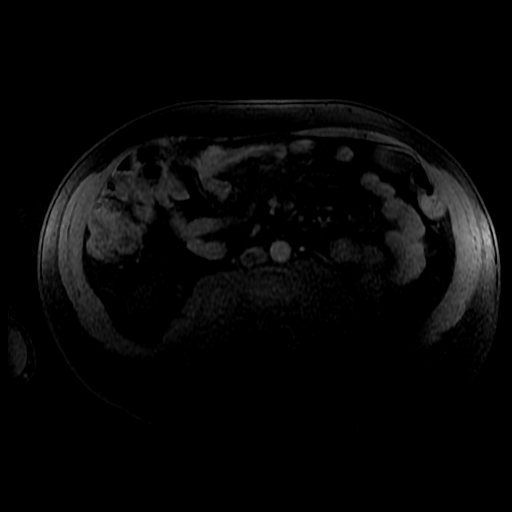
[im 52/104]
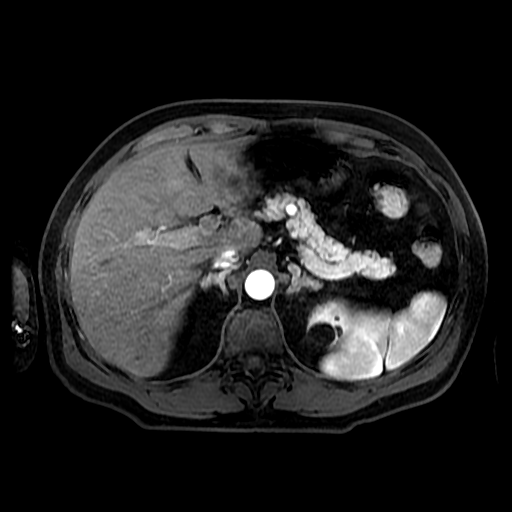
[im 104/104]
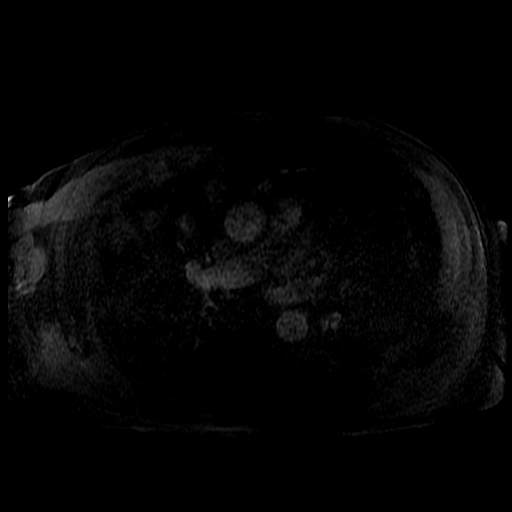

[21 of 48 positions shown; findings below may reference images not displayed]

FINDINGS: Lower chest:  Lung bases are clear.

Hepatobiliary: No focal hepatic lesion. Gallbladder normal. No
biliary abnormality

Pancreas: Pancreas normal

Spleen: Normal spleen.

Adrenals/urinary tract: Adrenal glands normal.

In the upper pole of the LEFT kidney there are 2 adjacent cysts
which have high signal intensity on T2 weighted imaging and no
post-contrast enhancement consistent benign Bosniak 1 renal cysts.
This cystic complex measures 2.7 cm and corresponds indeterminate
lesion on comparison CT.

There is caliceal dilatation in the LEFT upper pole and mid LEFT
kidney (image [DATE]) versus peripelvic cysts.

In the medial aspect of the RIGHT kidney, 14 mm cystic lesion also
has high signal intensity T2 weighted imaging and no post-contrast
enhancement consistent with benign cyst. No enhancing renal cortical
lesions.

Stomach/Bowel: Stomach and limited of the small bowel is
unremarkable

Vascular/Lymphatic: Abdominal aortic normal caliber. No
retroperitoneal periportal lymphadenopathy.

Musculoskeletal: No aggressive osseous lesion
IMPRESSION: 1. Bilateral Bosniak I renal cysts correspond to indeterminate renal
lesions on comparison CT.
2. Caliceal dilatation versus peripelvic cysts in the LEFT renal
hilum.

## 2018-11-06 DIAGNOSIS — I10 Essential (primary) hypertension: Secondary | ICD-10-CM | POA: Diagnosis not present

## 2021-04-01 ENCOUNTER — Other Ambulatory Visit: Payer: Self-pay | Admitting: Emergency Medicine

## 2021-04-01 DIAGNOSIS — R936 Abnormal findings on diagnostic imaging of limbs: Secondary | ICD-10-CM

## 2021-04-01 DIAGNOSIS — R42 Dizziness and giddiness: Secondary | ICD-10-CM

## 2021-04-06 ENCOUNTER — Ambulatory Visit
Admission: RE | Admit: 2021-04-06 | Discharge: 2021-04-06 | Disposition: A | Discharge status: Home / Self Care | Payer: 59 | Source: Ambulatory Visit | Attending: Emergency Medicine | Admitting: Emergency Medicine

## 2021-04-06 DIAGNOSIS — R42 Dizziness and giddiness: Secondary | ICD-10-CM

## 2022-02-26 ENCOUNTER — Encounter: Payer: Self-pay | Admitting: Emergency Medicine

## 2022-02-26 ENCOUNTER — Ambulatory Visit (INDEPENDENT_AMBULATORY_CARE_PROVIDER_SITE_OTHER): Payer: 59

## 2022-02-26 ENCOUNTER — Other Ambulatory Visit: Payer: Self-pay

## 2022-02-26 ENCOUNTER — Ambulatory Visit
Admission: EM | Admit: 2022-02-26 | Discharge: 2022-02-26 | Disposition: A | Discharge status: Home / Self Care | Payer: 59 | Attending: Emergency Medicine | Admitting: Emergency Medicine

## 2022-02-26 DIAGNOSIS — E1151 Type 2 diabetes mellitus with diabetic peripheral angiopathy without gangrene: Secondary | ICD-10-CM

## 2022-02-26 DIAGNOSIS — M79675 Pain in left toe(s): Secondary | ICD-10-CM

## 2022-02-26 DIAGNOSIS — J329 Chronic sinusitis, unspecified: Secondary | ICD-10-CM

## 2022-02-26 DIAGNOSIS — M19079 Primary osteoarthritis, unspecified ankle and foot: Secondary | ICD-10-CM

## 2022-02-26 DIAGNOSIS — J310 Chronic rhinitis: Secondary | ICD-10-CM

## 2022-02-26 MED ORDER — MOMETASONE FUROATE 50 MCG/ACT NA SUSP: 2.0000 | Freq: Every day | NASAL | 2 refills | Status: AC

## 2022-02-26 MED ORDER — AMPICILLIN 500 MG PO CAPS: 500.0000 mg | ORAL_CAPSULE | Freq: Four times a day (QID) | ORAL | 0 refills | Status: AC

## 2022-02-26 MED ORDER — PREDNISONE 10 MG PO TABS: ORAL_TABLET | ORAL | 0 refills | Status: AC

## 2022-02-26 MED ORDER — FEXOFENADINE HCL 180 MG PO TABS: 180.0000 mg | ORAL_TABLET | Freq: Every day | ORAL | 1 refills | Status: AC

## 2022-02-26 MED ORDER — IPRATROPIUM BROMIDE 0.06 % NA SOLN: 2.0000 | Freq: Three times a day (TID) | NASAL | 1 refills | Status: AC

## 2022-02-26 NOTE — ED Triage Notes (Signed)
Pt here for redness with swelling to left great toe x 4 days; pt sts some improvement but is pre diabetic

## 2022-02-26 NOTE — Discharge Instructions (Addendum)
Please continue ampicillin to complete a full 5-day course with a 500 mg dose in the morning and 500 mg dose in the evening for the next 4 days.  Because I believe that the prednisone provided significant benefit even at the 10 mg dose, I recommend that he begin a tapering dose starting at 60 mg.  Please divide the total daily dose of 60 mg in half or, if you prefer, you can give him 20 mg 3 times a day.  Please continue to do this for no longer than 3 days and if you prefer just to do it for 2 days that is fine to.  Please make sure that when you go down to the 40 mg/day dosage, that you give it to him twice daily and give it no longer than the 60 mg dose duration.  Finally, when he reach the 10 mg dose, please make sure you give it once daily for at least 2 days before completing.  Day 1 through 2 or 3: 30 mg twice daily or 20 mg 3 times daily  Day 2 or 3 through day 4 through 6: 20 mg twice daily  Day 4 through 6 through day 7 through 9: 10 mg twice daily  Final 2 days: Give 10 mg once daily  Your x-ray was not remarkable for any bone spurs or severe degeneration.  It does appear that you have some mild arthritis in your left great toe which may be contributory to your acute onset of swelling and redness.  The uric acid level that we checked today will reveal whether or not your pain, redness and swelling is related to gout or not.  Included some patient patient regarding gout which you may find useful.  For your rhinosinusitis which may be contributory to your bouts of dizziness, please begin Allegra, Flonase and Atrovent nasal spray.  Allegra (fexofenadine): This is an excellent second-generation antihistamine that helps to reduce respiratory inflammatory response to environmental allergens.  This medication is not known to cause daytime sleepiness so it can be taken in the daytime.  If you find that it does make you sleepy, please feel free to take it at bedtime.   Nasonex (mometasone): This  is a steroid nasal spray that you use once daily, 2 sprays in each nare.  This medication does not work well if you decide to use it only used as you feel you need to, it works best used on a daily basis.  After 3 to 5 days of use, you will notice significant reduction of the inflammation and mucus production that is currently being caused by exposure to allergens, whether seasonal or environmental.  The most common side effect of this medication is nosebleeds.  If you experience a nosebleed, please discontinue use for 1 week, then feel free to resume.  I have provided you with a prescription.     Atrovent (ipratropium): This is an excellent nasal decongestant spray that does not cause rebound congestion, please instill 2 sprays into each nare with each use.  Please use the spray up to 4 times daily as needed.  I have provided you with a prescription for this medication.      Thank you for visiting urgent care today.  It was a pleasure to meet you and I appreciate the opportunity to participate in your care.

## 2022-02-27 LAB — URIC ACID: Uric Acid: 4 mg/dL (ref 3.8–8.4)

## 2022-02-27 NOTE — ED Provider Notes (Signed)
[facility]    CSN: [id number] Arrival date & time: 02/26/22  0843    HISTORY   Chief Complaint  Patient presents with   Wound Check   HPI Christopher Graves is a 66 y.o. male. Patient presents to urgent care today complaining of redness and swelling of his left great toe over the past 4 days.  Patient states that he is prediabetic.  Patient states that his wife has been giving him medications including ampicillin 1000 mg twice daily x1 day, prednisone 10 mg x 1 day and he thinks that 1 of these 2 is helped but cannot identify which more so.  Patient denies history of gout.  EMR reviewed, patient has a history of small vessel disease.  Patient states he is never had any redness or swelling like this before.  Patient denies pain in his toe.  X-ray of left great toe was unremarkable.  Patient denies trauma to the left great toe.  Patient states he has a sensation of there being something under his left great toe like he has a rock in his shoe.  The history is provided by the patient.   Past Medical History:  Diagnosis Date   Chronic kidney disease 07-02-12   past hx. kidney stones   Diabetes mellitus without complication ([place]) 96-28-36   recent dx. borderline-no meds   H/O exercise stress test 05/07/2007   clinically/electrocardiographically negative   Hyperlipidemia 07/23/10   LDL 117 rest in range on meds   Hypertension    Patient Active Problem List   Diagnosis Date Noted   HTN (hypertension) 11/30/2011   Hyperlipidemia 11/30/2011   Mild anxiety 11/30/2011   Past Surgical History:  Procedure Laterality Date   CYSTOSCOPY/RETROGRADE/URETEROSCOPY/STONE EXTRACTION WITH BASKET  07/03/2012   Procedure: CYSTOSCOPY/RETROGRADE/URETEROSCOPY/STONE EXTRACTION WITH BASKET;  Surgeon: Ailene Rud, MD;  Location: [facility];  Service: Urology;  Laterality: Left;  CYSTOSCOPY LEFT RETROGRADE PYELOGRAM LEFT URETEROSCOPY WITH HOLMIUM LASER ,BASKET EXTRACTION LEFT DOUBLE J STENT     HERNIA REPAIR  07-02-12   bilateral   INGUINAL HERNIA REPAIR  2007   right x 2    Home Medications    Prior to Admission medications   Medication Sig Start Date End Date Taking? Authorizing Provider  ampicillin (PRINCIPEN) 500 MG capsule Take 1 capsule (500 mg total) by mouth 4 (four) times daily for 5 days. 02/26/22 03/03/22 Yes Lynden Oxford Scales, PA-C  fexofenadine (ALLEGRA) 180 MG tablet Take 1 tablet (180 mg total) by mouth daily. 02/26/22 08/25/22 Yes Lynden Oxford Scales, PA-C  ipratropium (ATROVENT) 0.06 % nasal spray Place 2 sprays into both nostrils 3 (three) times daily. As needed for nasal congestion, runny nose 02/26/22  Yes Lynden Oxford Scales, PA-C  mometasone (NASONEX) 50 MCG/ACT nasal spray Place 2 sprays into the nose daily. 02/26/22 05/27/22 Yes Lynden Oxford Scales, PA-C  predniSONE (DELTASONE) 10 MG tablet Take 3 tablets (30 mg total) by mouth 2 (two) times daily with a meal for 3 days, THEN 2 tablets (20 mg total) 2 (two) times daily with a meal for 3 days, THEN 1 tablet (10 mg total) 2 (two) times daily with a meal for 3 days. 02/26/22 03/07/22 Yes Lynden Oxford Scales, PA-C  aspirin 81 MG tablet Take 81 mg by mouth daily.    [provider]  calcium-vitamin D 250-100 MG-UNIT per tablet Take 1 tablet by mouth 2 (two) times daily.    [provider]  Cinnamon 500 MG TABS Take 1,000 mg by mouth  2 (two) times daily.    [provider]  fish oil-omega-3 fatty acids 1000 MG capsule Take 1 g by mouth daily.    [provider]  GREEN COFFEE BEAN PO Take 1 tablet by mouth 2 (two) times daily.    [provider]  losartan (COZAAR) 100 MG tablet Take 100 mg by mouth daily.    [provider]  Multiple Vitamin (MULTIVITAMIN) tablet Take 1 tablet by mouth daily.    [provider]  pravastatin (PRAVACHOL) 40 MG tablet Take 40 mg by mouth See admin instructions. Three times a week    [provider]   sertraline (ZOLOFT) 100 MG tablet Take 100 mg by mouth daily.    [provider]    Family History Family History  Problem Relation Age of Onset   Hypertension Father    Social History Social History   Tobacco Use   Smoking status: Former    Packs/day: 1.00    Years: 10.00    Total pack years: 10.00    Types: Cigarettes    Quit date: 07/02/1996    Years since quitting: 25.6  Substance Use Topics   Alcohol use: No   Drug use: No   Allergies   Patient has no known allergies.  Review of Systems Review of Systems Pertinent findings noted in history of present illness.   Physical Exam Triage Vital Signs ED Triage Vitals  Enc Vitals Group     BP 07/16/21 0827 (!) 147/82     Pulse Rate 07/16/21 0827 72     Resp 07/16/21 0827 18     Temp 07/16/21 0827 98.3 F (36.8 C)     Temp Source 07/16/21 0827 Oral     SpO2 07/16/21 0827 98 %     Weight --      Height --      Head Circumference --      Peak Flow --      Pain Score 07/16/21 0826 5     Pain Loc --      Pain Edu? --      Excl. in [place]? --   No data found.  Updated Vital Signs BP (!) 155/87 (BP Location: Left Arm)   Pulse 62   Temp 97.8 F (36.6 C) (Oral)   Resp 18   SpO2 97%   Physical Exam Vitals and nursing note reviewed.  Constitutional:      General: He is not in acute distress.    Appearance: Normal appearance. He is not ill-appearing.  HENT:     Head: Normocephalic and atraumatic.     Salivary Glands: Right salivary gland is not diffusely enlarged or tender. Left salivary gland is not diffusely enlarged or tender.     Right Ear: Ear canal and external ear normal. No drainage. A middle ear effusion is present. There is no impacted cerumen. Tympanic membrane is bulging. Tympanic membrane is not injected or erythematous.     Left Ear: Ear canal and external ear normal. No drainage. A middle ear effusion is present. There is no impacted cerumen. Tympanic membrane is bulging. Tympanic membrane is  not injected or erythematous.     Ears:     Comments: Bilateral EACs normal, both TMs bulging with clear fluid    Nose: Rhinorrhea present. No nasal deformity, septal deviation, signs of injury, nasal tenderness, mucosal edema or congestion. Rhinorrhea is clear.     Right Nostril: Occlusion present. No foreign body, epistaxis or septal hematoma.  Left Nostril: Occlusion present. No foreign body, epistaxis or septal hematoma.     Right Turbinates: Enlarged, swollen and pale.     Left Turbinates: Enlarged, swollen and pale.     Right Sinus: No maxillary sinus tenderness or frontal sinus tenderness.     Left Sinus: No maxillary sinus tenderness or frontal sinus tenderness.     Mouth/Throat:     Lips: Pink. No lesions.     Mouth: Mucous membranes are moist. No oral lesions.     Pharynx: Oropharynx is clear. Uvula midline. No posterior oropharyngeal erythema or uvula swelling.     Tonsils: No tonsillar exudate. 0 on the right. 0 on the left.     Comments: Postnasal drip Eyes:     General: Lids are normal.        Right eye: No discharge.        Left eye: No discharge.     Extraocular Movements: Extraocular movements intact.     Conjunctiva/sclera: Conjunctivae normal.     Right eye: Right conjunctiva is not injected.     Left eye: Left conjunctiva is not injected.  Neck:     Trachea: Trachea and phonation normal.  Cardiovascular:     Rate and Rhythm: Normal rate and regular rhythm.     Pulses: Normal pulses.     Heart sounds: Normal heart sounds. No murmur heard.    No friction rub. No gallop.  Pulmonary:     Effort: Pulmonary effort is normal. No accessory muscle usage, prolonged expiration or respiratory distress.     Breath sounds: Normal breath sounds. No stridor, decreased air movement or transmitted upper airway sounds. No decreased breath sounds, wheezing, rhonchi or rales.  Chest:     Chest wall: No tenderness.  Musculoskeletal:        General: No swelling, tenderness,  deformity or signs of injury. Normal range of motion.     Cervical back: Normal range of motion and neck supple. Normal range of motion.     Right lower leg: No edema.     Left lower leg: No edema.  Lymphadenopathy:     Cervical: No cervical adenopathy.  Skin:    General: Skin is warm and dry.     Findings: Bruising (Bottom of left great toe) and erythema (Lateral aspect of left great toe without swelling) present. No rash.  Neurological:     General: No focal deficit present.     Mental Status: He is alert and oriented to person, place, and time.  Psychiatric:        Mood and Affect: Mood normal.        Behavior: Behavior normal.     Visual Acuity Right Eye Distance:   Left Eye Distance:   Bilateral Distance:    Right Eye Near:   Left Eye Near:    Bilateral Near:     UC Couse / Diagnostics / Procedures:    EKG  Radiology DG Toe Great Left  Result Date: 02/26/2022 CLINICAL DATA:  redness, swelling, some pain EXAM: LEFT GREAT TOE COMPARISON:  None Available. FINDINGS: There is no evidence of acute fracture. Alignment is normal. There are mild degenerative changes of the first MTP joint. Mild great toe soft tissue swelling. IMPRESSION: Mild great toe soft tissue swelling.  No acute osseous abnormality. Electronically Signed   By: Maurine Simmering M.D.   On: 02/26/2022 10:45    Procedures Procedures (including critical care time)  UC Diagnoses / Final Clinical Impressions(s)  I have reviewed the triage vital signs and the nursing notes.  Pertinent labs & imaging results that were available during my care of the patient were reviewed by me and considered in my medical decision making (see chart for details).    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  Patient provided with prescriptions for Allegra, Atrovent and Nasonex.  Patient advised recommend he complete a full 5-day course of ampicillin by taking 500 mg 4 times daily since he is already taken a full day course.  Patient also provided with a tapering dose of prednisone that he can discontinue once the redness and discomfort has resolved.  Return precautions advised.  ED Prescriptions     Medication Sig Dispense Auth. Provider   ampicillin (PRINCIPEN) 500 MG capsule Take 1 capsule (500 mg total) by mouth 4 (four) times daily for 5 days. 20 capsule Lynden Oxford Scales, PA-C   predniSONE (DELTASONE) 10 MG tablet Take 3 tablets (30 mg total) by mouth 2 (two) times daily with a meal for 3 days, THEN 2 tablets (20 mg total) 2 (two) times daily with a meal for 3 days, THEN 1 tablet (10 mg total) 2 (two) times daily with a meal for 3 days. 36 tablet Lynden Oxford Scales, PA-C   fexofenadine (ALLEGRA) 180 MG tablet Take 1 tablet (180 mg total) by mouth daily. 90 tablet Lynden Oxford Scales, PA-C   mometasone (NASONEX) 50 MCG/ACT nasal spray Place 2 sprays into the nose daily. 1 each Lynden Oxford Scales, PA-C   ipratropium (ATROVENT) 0.06 % nasal spray Place 2 sprays into both nostrils 3 (three) times daily. As needed for nasal congestion, runny nose 15 mL Lynden Oxford Scales, PA-C      PDMP not reviewed this encounter.  Pending results:  Labs Reviewed  URIC ACID    Medications Ordered in UC: Medications - No data to display  Disposition Upon Discharge:  Condition: stable for discharge home Home: take medications as prescribed; routine discharge instructions as discussed; follow up as advised.  Patient presented with an acute illness with associated systemic symptoms and significant discomfort requiring urgent management. In my opinion, this is a condition that a prudent lay person (someone who possesses an average knowledge of health and medicine)  may potentially expect to result in complications if not addressed urgently such as respiratory distress, impairment of bodily function or dysfunction of bodily organs.   Routine symptom specific, illness specific and/or disease specific instructions were discussed with the patient and/or caregiver at length.   As such, the patient has been evaluated and assessed, work-up was performed and treatment was provided in alignment with urgent care protocols and evidence based medicine.  Patient/parent/caregiver has been advised that the patient may require follow up for further testing and treatment if the symptoms continue in spite of treatment, as clinically indicated and appropriate.  Patient/parent/caregiver has been advised to return to the [facility] or PCP if no better; to PCP or the Emergency Department if new signs and symptoms develop, or if the current signs or symptoms continue to change or worsen for further workup, evaluation and treatment as clinically indicated and appropriate  The patient will follow up with their current PCP if and as advised. If the patient  does not currently have a PCP we will assist them in obtaining one.   The patient may need specialty follow up if the symptoms continue, in spite of conservative treatment and management, for further workup, evaluation, consultation and treatment as clinically indicated and appropriate.   Patient/parent/caregiver verbalized understanding and agreement of plan as discussed.  All questions were addressed during visit.  Please see discharge instructions below for further details of plan.  Discharge Instructions:   Discharge Instructions      Please continue ampicillin to complete a full 5-day course with a 500 mg dose in the morning and 500 mg dose in the evening for the next 4 days.  Because I believe that the prednisone provided significant benefit even at the 10 mg dose, I recommend that he begin a tapering dose starting at 60 mg.   Please divide the total daily dose of 60 mg in half or, if you prefer, you can give him 20 mg 3 times a day.  Please continue to do this for no longer than 3 days and if you prefer just to do it for 2 days that is fine to.  Please make sure that when you go down to the 40 mg/day dosage, that you give it to him twice daily and give it no longer than the 60 mg dose duration.  Finally, when he reach the 10 mg dose, please make sure you give it once daily for at least 2 days before completing.  Day 1 through 2 or 3: 30 mg twice daily or 20 mg 3 times daily  Day 2 or 3 through day 4 through 6: 20 mg twice daily  Day 4 through 6 through day 7 through 9: 10 mg twice daily  Final 2 days: Give 10 mg once daily  Your x-ray was not remarkable for any bone spurs or severe degeneration.  It does appear that you have some mild arthritis in your left great toe which may be contributory to your acute onset of swelling and redness.  The uric acid level that we checked today will reveal whether or not your pain, redness and swelling is related to gout or not.  Included some patient patient regarding gout which you may find useful.  For your rhinosinusitis which may be contributory to your bouts of dizziness, please begin Allegra, Flonase and Atrovent nasal spray.  Allegra (fexofenadine): This is an excellent second-generation antihistamine that helps to reduce respiratory inflammatory response to environmental allergens.  This medication is not known to cause daytime sleepiness so it can be taken in the daytime.  If you find that it does make you sleepy, please feel free to take it at bedtime.   Nasonex (mometasone): This is a steroid nasal spray that you use once daily, 2 sprays in each nare.  This medication does not work well if you decide to use it only used as you feel you need to, it works best used on a daily basis.  After 3 to 5 days of use, you will notice significant reduction of the inflammation and mucus  production that is currently being caused by exposure to allergens, whether seasonal or environmental.  The most common side effect of this medication is nosebleeds.  If you experience a nosebleed, please discontinue use for 1 week, then feel free to resume.  I have provided you with a prescription.     Atrovent (ipratropium): This is an excellent nasal decongestant spray that does not cause rebound congestion, please instill  2 sprays into each nare with each use.  Please use the spray up to 4 times daily as needed.  I have provided you with a prescription for this medication.      Thank you for visiting urgent care today.  It was a pleasure to meet you and I appreciate the opportunity to participate in your care.         This office note has been dictated using Museum/gallery curator.  Unfortunately, and despite my best efforts, this method of dictation can sometimes lead to occasional typographical or grammatical errors.  I apologize in advance if this occurs.     Lynden Oxford Scales, [place] 02/27/22 [phone]

## 2023-01-09 ENCOUNTER — Emergency Department (HOSPITAL_COMMUNITY): Payer: 59

## 2023-01-09 ENCOUNTER — Emergency Department (HOSPITAL_COMMUNITY)
Admission: EM | Admit: 2023-01-09 | Discharge: 2023-01-09 | Disposition: A | Discharge status: Home / Self Care | Payer: 59 | Attending: Emergency Medicine | Admitting: Emergency Medicine

## 2023-01-09 DIAGNOSIS — Z23 Encounter for immunization: Secondary | ICD-10-CM | POA: Insufficient documentation

## 2023-01-09 DIAGNOSIS — S0181XA Laceration without foreign body of other part of head, initial encounter: Secondary | ICD-10-CM | POA: Insufficient documentation

## 2023-01-09 DIAGNOSIS — W19XXXA Unspecified fall, initial encounter: Secondary | ICD-10-CM

## 2023-01-09 DIAGNOSIS — S12601A Unspecified nondisplaced fracture of seventh cervical vertebra, initial encounter for closed fracture: Secondary | ICD-10-CM | POA: Diagnosis present

## 2023-01-09 DIAGNOSIS — Y93E9 Activity, other interior property and clothing maintenance: Secondary | ICD-10-CM | POA: Insufficient documentation

## 2023-01-09 DIAGNOSIS — W11XXXA Fall on and from ladder, initial encounter: Secondary | ICD-10-CM | POA: Insufficient documentation

## 2023-01-09 LAB — URINALYSIS, ROUTINE W REFLEX MICROSCOPIC
Bilirubin Urine: NEGATIVE
Glucose, UA: 150 mg/dL — AB
Hgb urine dipstick: NEGATIVE
Ketones, ur: NEGATIVE mg/dL
Leukocytes,Ua: NEGATIVE
Nitrite: NEGATIVE
Protein, ur: NEGATIVE mg/dL
Specific Gravity, Urine: 1.031 — ABNORMAL HIGH (ref 1.005–1.030)
pH: 7 (ref 5.0–8.0)

## 2023-01-09 LAB — COMPREHENSIVE METABOLIC PANEL
ALT: 32 U/L (ref 0–44)
AST: 38 U/L (ref 15–41)
Albumin: 4.1 g/dL (ref 3.5–5.0)
Alkaline Phosphatase: 51 U/L (ref 38–126)
Anion gap: 10 (ref 5–15)
BUN: 15 mg/dL (ref 8–23)
CO2: 25 mmol/L (ref 22–32)
Calcium: 9.1 mg/dL (ref 8.9–10.3)
Chloride: 108 mmol/L (ref 98–111)
Creatinine, Ser: 1.78 mg/dL — ABNORMAL HIGH (ref 0.61–1.24)
GFR, Estimated: 42 mL/min — ABNORMAL LOW (ref >60)
Glucose, Bld: 185 mg/dL — ABNORMAL HIGH (ref 70–99)
Potassium: 3.8 mmol/L (ref 3.5–5.1)
Sodium: 143 mmol/L (ref 135–145)
Total Bilirubin: 0.9 mg/dL (ref 0.3–1.2)
Total Protein: 7 g/dL (ref 6.5–8.1)

## 2023-01-09 LAB — I-STAT CHEM 8, ED
BUN: 17 mg/dL (ref 8–23)
Calcium, Ion: 1.14 mmol/L — ABNORMAL LOW (ref 1.15–1.40)
Chloride: 108 mmol/L (ref 98–111)
Creatinine, Ser: 1.7 mg/dL — ABNORMAL HIGH (ref 0.61–1.24)
Glucose, Bld: 188 mg/dL — ABNORMAL HIGH (ref 70–99)
HCT: 46 % (ref 39.0–52.0)
Hemoglobin: 15.6 g/dL (ref 13.0–17.0)
Potassium: 3.7 mmol/L (ref 3.5–5.1)
Sodium: 144 mmol/L (ref 135–145)
TCO2: 25 mmol/L (ref 22–32)

## 2023-01-09 LAB — CBC
HCT: 47.6 % (ref 39.0–52.0)
Hemoglobin: 16.1 g/dL (ref 13.0–17.0)
MCH: 31.3 pg (ref 26.0–34.0)
MCHC: 33.8 g/dL (ref 30.0–36.0)
MCV: 92.4 fL (ref 80.0–100.0)
Platelets: 171 10*3/uL (ref 150–400)
RBC: 5.15 MIL/uL (ref 4.22–5.81)
RDW: 14.1 % (ref 11.5–15.5)
WBC: 8.4 10*3/uL (ref 4.0–10.5)
nRBC: 0 % (ref 0.0–0.2)

## 2023-01-09 LAB — ETHANOL: Alcohol, Ethyl (B): <10 mg/dL (ref <10)

## 2023-01-09 LAB — PROTIME-INR
INR: 1.1 (ref 0.8–1.2)
Prothrombin Time: 14 seconds (ref 11.4–15.2)

## 2023-01-09 MED ORDER — ACETAMINOPHEN 500 MG PO TABS
1000.0000 mg | ORAL_TABLET | Freq: Once | ORAL | Status: AC
  Administered 2023-01-09: 1000 mg via ORAL
  Filled 2023-01-09: qty 2

## 2023-01-09 MED ORDER — IBUPROFEN 800 MG PO TABS: 800.0000 mg | ORAL_TABLET | Freq: Three times a day (TID) | ORAL | 0 refills | Status: AC

## 2023-01-09 MED ORDER — LIDOCAINE-EPINEPHRINE (PF) 2 %-1:200000 IJ SOLN
INTRAMUSCULAR | Status: AC
  Filled 2023-01-09: qty 20

## 2023-01-09 MED ORDER — TETANUS-DIPHTH-ACELL PERTUSSIS 5-2.5-18.5 LF-MCG/0.5 IM SUSY
0.5000 mL | PREFILLED_SYRINGE | Freq: Once | INTRAMUSCULAR | Status: AC
  Administered 2023-01-09: 0.5 mL via INTRAMUSCULAR
  Filled 2023-01-09: qty 0.5

## 2023-01-09 MED ORDER — LIDOCAINE-EPINEPHRINE (PF) 2 %-1:200000 IJ SOLN
20.0000 mL | Freq: Once | INTRAMUSCULAR | Status: AC
  Administered 2023-01-09: 20 mL via INTRADERMAL
  Filled 2023-01-09: qty 20

## 2023-01-09 MED ORDER — IOHEXOL 350 MG/ML SOLN
75.0000 mL | Freq: Once | INTRAVENOUS | Status: AC | PRN
  Administered 2023-01-09: 75 mL via INTRAVENOUS

## 2023-01-09 MED ORDER — KETOROLAC TROMETHAMINE 15 MG/ML IJ SOLN
15.0000 mg | Freq: Once | INTRAMUSCULAR | Status: AC
  Administered 2023-01-09: 15 mg via INTRAVENOUS
  Filled 2023-01-09: qty 1

## 2023-01-09 NOTE — Discharge Instructions (Addendum)
Wear collar for 1 week and follow up with Neurosurgery You have a laceration of the left part of the face.  We apply stitches please follow-up with your primary care provider for reassessment.

## 2023-01-09 NOTE — ED Provider Notes (Addendum)
[place] EMERGENCY DEPARTMENT AT [facility] Provider Note   CSN: [id number] Arrival date & time: 01/09/23  1433     History Chief Complaint  Patient presents with   Fall    HPI Marten Iles is a 67 y.o. male presenting for follow-up of a ladder.  He is 67 year old male with a relatively minimal medical history.  He does not really happen but he states he is approximately 20 to 30 feet up has a left head laceration and was intermittently confused with EMS therefore brought in as a level 2 trauma.  He denies anticoagulation.  Currently alert and oriented..   Patient's recorded medical, surgical, social, medication list and allergies were reviewed in the Snapshot window as part of the initial history.   Review of Systems   Review of Systems  Unable to perform ROS: Acuity of condition    Physical Exam Updated Vital Signs BP (!) 156/93   Pulse 69   Temp 98.2 F (36.8 C) (Oral)   Resp 10   Ht 5\' 11"  (1.803 m)   Wt 81.6 kg   SpO2 99%   BMI 25.10 kg/m  Physical Exam Physical Exam  Neurologic: GCS 15, motor intact in all four extremities, sensory intact in all 4 extremities  Head: Pupils are 3mm, equally round and reactive to light, patient has mild (3cm lac) obvious facial trauma, no hemotympanum  Neck: patient has no midline neck tenderness, no obvious injuries.  Thorax: Patient has stable clavicles, stable thorax with bilateral chest rise and breath sounds heard.  No penetrating thoracic injury.  CV/Pulm: RRR, no audible murmer/rubs/gallops, CTAB  Abdomen: Patient has no abdominal distention, no penetrating abdominal injury.  Back: Patient has no midline spinal tenderness in the thoracic and lumbar spine, patient has no paraspinal tenderness bilaterally.  Pelvis: Patient has a stable pelvis to compression with palpable femoral pulses.  Extremities:Patient's upper extremities with no obvious injury or abnormality, radial pulses present. Patient's lower extremities  with no obvious injury or abnormality, tibial pulses present.    ED Course/ Medical Decision Making/ A&P Clinical Course as of 01/09/23 1717  Mon Jan 09, 2023  1523 2. Acute appearing fracture through a superior endplate osteophyte at C7. Assessment of the spinal canal at this level is limited due to streak artifact.   [CC]  1552 3. Left thyroid nodule measuring 18 mm. Recommend nonemergent thyroid US (ref: J Am Coll Radiol. 2015 Feb;12(2): 143-50).   [CC]    Clinical Course User Index [CC] Glyn Ade, MD    Procedures .Marland KitchenLaceration Repair  Date/Time: 01/09/2023 5:35 PM  Performed by: Glyn Ade, MD Authorized by: Glyn Ade, MD   Consent:    Consent obtained:  Verbal   Consent given by:  Patient   Risks, benefits, and alternatives were discussed: yes     Risks discussed:  Infection, pain, need for additional repair and poor cosmetic result Laceration details:    Location:  Face   Length (cm):  3.5 Pre-procedure details:    Preparation:  Patient was prepped and draped in usual sterile fashion Skin repair:    Repair method:  Sutures   Suture size:  4-0   Suture technique:  Simple interrupted   Number of sutures:  3 Repair type:    Repair type:  Simple Post-procedure details:    Dressing:  Open (no dressing)   Procedure completion:  Tolerated    Medications Ordered in ED Medications  lidocaine-EPINEPHrine (XYLOCAINE W/EPI) 2 %-1:200000 (PF) injection (  Not Given 01/09/23 1600)  iohexol (OMNIPAQUE) 350 MG/ML injection 75 mL (75 mLs Intravenous Contrast Given 01/09/23 1503)  lidocaine-EPINEPHrine (XYLOCAINE W/EPI) 2 %-1:200000 (PF) injection 20 mL (20 mLs Intradermal Given by Other 01/09/23 1557)  Tdap (BOOSTRIX) injection 0.5 mL (0.5 mLs Intramuscular Given 01/09/23 1558)   Medical Decision Making:    Emory Gallentine is a 67 y.o. male who presented to the ED today with a high mechanisma trauma, detailed above.    By institutional and departmental  policy this was activated as a level 2 trauma. Patient's presentation is complicated by their history of advanced age.  Patient placed on continuous vitals and telemetry monitoring while in ED which was reviewed periodically.   Given this mechanism of trauma, a full physical exam was performed.  Reviewed and confirmed nursing documentation for past medical history, family history, social history.    Initial Assessment/Plan:   I was called emergently to patient's bedside for a primary survey.  Primary survey: Airway intact.  BL breath sounds present.   Circulation established with WNL BP, 2 large bore IVs, and radial/femoral pulses.   Disability evaluation negative. No obvious disability requiring intervention.   Patient fully exposed and all injuries were noted, any penetrating injuries were labeled with radiopaque markers.  No emergent interventions took place in the primary survey.    Patient stable for CXR that demonstrated no traumatic hemopneumothorax and PXR that demonstrated no unstable pelvic fractures.  EFAST deferred.   Secondary survey: Once patient was stabilized, I personally performed a secondary survey to evaluate for any other injuries.  Results of this evaluation documented in the physical exam section. This is a patient presenting with a high mechanism trauma.  As such, I have considered intracranial injuries including intracranial hemorrhage, intrathoracic injuries including blunt myocardial or blunt lung injury, blunt abdominal injuries including aortic dissection, bladder injury, spleen injury, liver injury and I have considered orthopedic injuries including extremity or spinal injury.   This was all evaluated by the below imaging as well as concurrently ordered laboratory evaluation which was reviewed.  Radiology: All radiology results were reviewed independently and agree with reads per radiology provider. DG Knee Right Port  Result Date: 01/09/2023 CLINICAL DATA:   Fall, pain EXAM: PORTABLE RIGHT KNEE - 1-2 VIEW COMPARISON:  None Available. FINDINGS: No fracture or dislocation of the right knee. Joint spaces are preserved. No knee joint effusion. Soft tissues unremarkable. IMPRESSION: No fracture or dislocation of the right knee. Joint spaces are preserved. No knee joint effusion. Electronically Signed   By: Jearld Lesch M.D.   On: 01/09/2023 16:32   CT CHEST ABDOMEN PELVIS W CONTRAST  Result Date: 01/09/2023 CLINICAL DATA:  Blunt poly trauma. Fall from ladder. EXAM: CT CHEST, ABDOMEN, AND PELVIS WITH CONTRAST TECHNIQUE: Multidetector CT imaging of the chest, abdomen and pelvis was performed following the standard protocol during bolus administration of intravenous contrast. RADIATION DOSE REDUCTION: This exam was performed according to the departmental dose-optimization program which includes automated exposure control, adjustment of the mA and/or kV according to patient size and/or use of iterative reconstruction technique. CONTRAST:  75mL OMNIPAQUE IOHEXOL 350 MG/ML SOLN COMPARISON:  Chest and pelvic radiographs earlier today. Abdominal MRI 02/19/2018, CT 01/31/2018 FINDINGS: CT CHEST FINDINGS Cardiovascular: No evidence of acute aortic or vascular injury. The heart is normal in size. No pericardial effusion. Occasional coronary artery calcifications. Mediastinum/Nodes: No mediastinal hemorrhage or hematoma. No pneumomediastinum. No adenopathy. Tiny hiatal hernia. Irregularly-shaped hypodense thyroid nodule in the left measures 18 mm  greatest dimension. Lungs/Pleura: No pneumothorax. No pulmonary contusion. No focal airspace disease. No pleural fluid. Trachea and central airways are clear. Musculoskeletal: No acute fracture of the ribs, sternum, included clavicles or shoulder girdles. No fracture or malalignment of the thoracic spine. Slight scoliotic curvature in thoracic degenerative change. Scattered soft tissue edema posteriorly. CT ABDOMEN PELVIS FINDINGS  Hepatobiliary: No hepatic injury or perihepatic hematoma. Gallbladder is unremarkable. Pancreas: No evidence of injury. No ductal dilatation or inflammation. Spleen: No splenic injury or perisplenic hematoma. Adrenals/Urinary Tract: No adrenal hemorrhage. 16 mm left adrenal nodule has been stable dating back to 2019 and is likely benign. No evidence of renal injury. Stable appearance of bilateral renal cysts. Nonobstructing stone in the upper pole of the right kidney. Decompressed ureters. No bladder injury or wall thickening. Stomach/Bowel: The stomach is distended. Dependent density within ingested material in the stomach. There is no evidence of bowel wall thickening, inflammation or bowel injury. Normal appendix is visualized. Small volume of colonic stool. Mild descending colonic diverticulosis. No diverticulitis. Vascular/Lymphatic: No evidence of vascular injury. No mesenteric hematoma. Mild aortic atherosclerosis. No aneurysm. Patent portal and splenic veins. No adenopathy. Reproductive: Prostate is unremarkable. Other: No free air or free fluid. Moderate-sized fat containing left inguinal hernia. Prior right inguinal hernia repair. Patchy subcutaneous edema posteriorly. Musculoskeletal: No acute lumbar spine fracture. Lower lumbar degenerative disc disease. No pelvic fracture. IMPRESSION: 1. Scattered edema in the posterior subcutaneous tissues. No evidence of additional acute traumatic injury to the chest, abdomen, or pelvis. 2. Incidental findings in the abdomen and pelvis include nonobstructing right renal stone, renal cysts (needing no further imaging follow-up) and colonic diverticulosis. 3. Left thyroid nodule measuring 18 mm. Recommend nonemergent thyroid US (ref: J Am Coll Radiol. 2015 Feb;12(2): 143-50). Aortic Atherosclerosis (ICD10-I70.0). Electronically Signed   By: Narda Rutherford M.D.   On: 01/09/2023 15:30   CT HEAD WO CONTRAST  Result Date: 01/09/2023 CLINICAL DATA:  Trauma EXAM: CT  HEAD WITHOUT CONTRAST CT CERVICAL SPINE WITHOUT CONTRAST TECHNIQUE: Multidetector CT imaging of the head and cervical spine was performed following the standard protocol without intravenous contrast. Multiplanar CT image reconstructions of the cervical spine were also generated. RADIATION DOSE REDUCTION: This exam was performed according to the departmental dose-optimization program which includes automated exposure control, adjustment of the mA and/or kV according to patient size and/or use of iterative reconstruction technique. COMPARISON:  None Available. FINDINGS: CT HEAD FINDINGS Brain: No evidence of acute infarction, hemorrhage, hydrocephalus, extra-axial collection or mass lesion/mass effect. Vascular: No hyperdense vessel or unexpected calcification. Skull: There is a soft hematoma along left frontal scalp. No evidence of underlying calvarial fracture. Sinuses/Orbits: No middle ear effusion. No mastoid effusion. Complete opacification of the left maxillary sinus with osseous changes suggestive of chronic left maxillary sinusitis. An air-fluid level in the left sphenoid sinus, which can be seen in the setting of acute sinusitis. Orbits are unremarkable. Other: None. CT CERVICAL SPINE FINDINGS Alignment: Normal. Skull base and vertebrae: There is an acute appearing fracture through superior endplate osteophyte at C7 (series 9, image 25). Soft tissues and spinal canal: No prevertebral fluid or swelling. No visible canal hematoma. Disc levels: No evidence of high-grade spinal canal stenosis. There are likely small disc bulges at the C6-C7 and C7-T1 level. Upper chest: Negative. Other: 2.0 cm left thyroid nodule. Recommend further evaluation with a thyroid ultrasound if previously performed. IMPRESSION: 1. No acute intracranial abnormality. 2. Acute appearing fracture through a superior endplate osteophyte at C7. Assessment of the spinal  canal at this level is limited due to streak artifact. 3. Left frontal  scalp hematoma. No evidence of underlying calvarial fracture. 4. Complete opacification of the left maxillary sinus with osseous changes suggestive of chronic left maxillary sinusitis. An air-fluid level in the left sphenoid sinus, which can be seen in the setting of acute sinusitis. 5. 2.0 cm left thyroid nodule. Recommend further evaluation dedicated thyroid ultrasound on a nonemergent basis, if not previously performed. Electronically Signed   By: Lorenza Cambridge M.D.   On: 01/09/2023 15:20   CT CERVICAL SPINE WO CONTRAST  Result Date: 01/09/2023 CLINICAL DATA:  Trauma EXAM: CT HEAD WITHOUT CONTRAST CT CERVICAL SPINE WITHOUT CONTRAST TECHNIQUE: Multidetector CT imaging of the head and cervical spine was performed following the standard protocol without intravenous contrast. Multiplanar CT image reconstructions of the cervical spine were also generated. RADIATION DOSE REDUCTION: This exam was performed according to the departmental dose-optimization program which includes automated exposure control, adjustment of the mA and/or kV according to patient size and/or use of iterative reconstruction technique. COMPARISON:  None Available. FINDINGS: CT HEAD FINDINGS Brain: No evidence of acute infarction, hemorrhage, hydrocephalus, extra-axial collection or mass lesion/mass effect. Vascular: No hyperdense vessel or unexpected calcification. Skull: There is a soft hematoma along left frontal scalp. No evidence of underlying calvarial fracture. Sinuses/Orbits: No middle ear effusion. No mastoid effusion. Complete opacification of the left maxillary sinus with osseous changes suggestive of chronic left maxillary sinusitis. An air-fluid level in the left sphenoid sinus, which can be seen in the setting of acute sinusitis. Orbits are unremarkable. Other: None. CT CERVICAL SPINE FINDINGS Alignment: Normal. Skull base and vertebrae: There is an acute appearing fracture through superior endplate osteophyte at C7 (series 9,  image 25). Soft tissues and spinal canal: No prevertebral fluid or swelling. No visible canal hematoma. Disc levels: No evidence of high-grade spinal canal stenosis. There are likely small disc bulges at the C6-C7 and C7-T1 level. Upper chest: Negative. Other: 2.0 cm left thyroid nodule. Recommend further evaluation with a thyroid ultrasound if previously performed. IMPRESSION: 1. No acute intracranial abnormality. 2. Acute appearing fracture through a superior endplate osteophyte at C7. Assessment of the spinal canal at this level is limited due to streak artifact. 3. Left frontal scalp hematoma. No evidence of underlying calvarial fracture. 4. Complete opacification of the left maxillary sinus with osseous changes suggestive of chronic left maxillary sinusitis. An air-fluid level in the left sphenoid sinus, which can be seen in the setting of acute sinusitis. 5. 2.0 cm left thyroid nodule. Recommend further evaluation dedicated thyroid ultrasound on a nonemergent basis, if not previously performed. Electronically Signed   By: Lorenza Cambridge M.D.   On: 01/09/2023 15:20   DG Pelvis Portable  Result Date: 01/09/2023 CLINICAL DATA:  Fall from 20 feet, off ladder EXAM: PORTABLE PELVIS 1 VIEW COMPARISON:  04/29/2022 FINDINGS: Single AP view of the pelvis. There is no evidence of pelvic fracture or diastasis. No pelvic bone lesions are seen. Surgical clips in the right lower quadrant. IMPRESSION: Negative. Electronically Signed   By: Wiliam Ke M.D.   On: 01/09/2023 14:59   DG Chest Port 1 View  Result Date: 01/09/2023 CLINICAL DATA:  Trauma EXAM: PORTABLE CHEST 1 VIEW COMPARISON:  CXR 07/03/12 FINDINGS: No pleural effusion. No pneumothorax. No focal airspace opacity. Normal cardiac and mediastinal contours. No radiographically apparent displaced rib fractures. Visualized upper abdomen is unremarkable. IMPRESSION: No radiographic evidence of traumatic injury. Electronically Signed   By: Lorenza Cambridge  M.D.   On:  01/09/2023 14:57    Final Reassessment and Plan:   Patient's history of present illness and physical exam findings do not reveal any acute pathology.  He had a C7 osteophyte fracture for which I consulted neurosurgery.  Their PA recommended hard collar for 1 week with follow-up at that time for flexion and extension films. His laceration was repaired as above.  His mental status continued to improve throughout the 2-1/2-hour observation window He does have a right knee swelling for which I added on a right knee x-ray and appears to be a hematoma.  A pressure dressing was placed and hematoma is already resorbing after observation window. Given well appearance I believe there is no acute indication for further intervention at this time.  Patient discharged with plan to follow-up with neurosurgery PCP in the outpatient setting. Disposition:  I have considered need for hospitalization, however, considering all of the above, I believe this patient is stable for discharge at this time.  Patient/family educated about specific return precautions for given chief complaint and symptoms.  Patient/family educated about follow-up with PCP.     Patient/family expressed understanding of return precautions and need for follow-up. Patient spoken to regarding all imaging and laboratory results and appropriate follow up for these results. All education provided in verbal form with additional information in written form. Time was allowed for answering of patient questions. Patient discharged.    Emergency Department Medication Summary:   Medications  lidocaine-EPINEPHrine (XYLOCAINE W/EPI) 2 %-1:200000 (PF) injection (  Not Given 01/09/23 1600)  iohexol (OMNIPAQUE) 350 MG/ML injection 75 mL (75 mLs Intravenous Contrast Given 01/09/23 1503)  lidocaine-EPINEPHrine (XYLOCAINE W/EPI) 2 %-1:200000 (PF) injection 20 mL (20 mLs Intradermal Given by Other 01/09/23 1557)  Tdap (BOOSTRIX) injection 0.5 mL (0.5 mLs Intramuscular  Given 01/09/23 1558)        Clinical Impression:  1. Fall, initial encounter   2. Facial laceration, initial encounter   3. Closed nondisplaced fracture of seventh cervical vertebra, unspecified fracture morphology, initial encounter      Discharge   Final Clinical Impression(s) / ED Diagnoses Final diagnoses:  Fall, initial encounter  Facial laceration, initial encounter  Closed nondisplaced fracture of seventh cervical vertebra, unspecified fracture morphology, initial encounter    Rx / DC Orders ED Discharge Orders     None         Glyn Ade, MD 01/09/23 1717    Glyn Ade, MD 01/09/23 1736

## 2023-01-09 NOTE — ED Triage Notes (Signed)
Larey Seat off a ladder approx 20 ft while doing maintenance. Not on blood thinners. Unresponsive. Laying on concrete ground. GCS 13. Unable to remember events.   EMS VS: Bp 120/100 HR 80

## 2023-01-09 NOTE — ED Notes (Signed)
[place] applied at this time. Education provided.

## 2023-01-09 NOTE — ED Notes (Signed)
Called [facility] dispatch and they will contact medic 50 about the whereabouts of the patients boots with orthotic devices.  [phone] Medic 50 ran the call.

## 2023-01-09 NOTE — ED Notes (Signed)
Pt came in via EMS after sustaining a fall. 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 Approximate height is 20 ft. Pt was found laying on concrete ground unresponsive initially. EMS initial GCS was 13. Pt has a laceration on top of left eye, bleeding controlled with a gauze dressing. Abrasions noted to left hand. Complains of a headache at 3/10 pain scale. Alert and oriented x 4 now. GCS is currently 15. Unable to remember events. Pt does not take blood thinners but reports taking Aspirin daily. Able to move all extremities at this time. Cardiac monitoring in place.

## 2023-01-09 NOTE — Progress Notes (Signed)
   01/09/23 1500  Spiritual Encounters  Type of Visit Initial  Care provided to: Patient  Referral source Trauma page  Reason for visit Trauma  OnCall Visit No   Ch responded to trauma page. There was no family at bedside. Ch tried to connect with pt's family member Misty Stanley [phone]) but she was not available. No follow-up needed at this time.

## 2023-11-07 ENCOUNTER — Emergency Department: Payer: Medicare Other

## 2023-11-07 ENCOUNTER — Other Ambulatory Visit: Payer: Self-pay

## 2023-11-07 ENCOUNTER — Emergency Department
Admission: EM | Admit: 2023-11-07 | Discharge: 2023-11-07 | Disposition: A | Discharge status: Home / Self Care | Payer: Medicare Other | Attending: Student in an Organized Health Care Education/Training Program | Admitting: Student in an Organized Health Care Education/Training Program

## 2023-11-07 DIAGNOSIS — W208XXA Other cause of strike by thrown, projected or falling object, initial encounter: Secondary | ICD-10-CM | POA: Diagnosis not present

## 2023-11-07 DIAGNOSIS — R0789 Other chest pain: Secondary | ICD-10-CM | POA: Diagnosis present

## 2023-11-07 DIAGNOSIS — R42 Dizziness and giddiness: Secondary | ICD-10-CM | POA: Diagnosis present

## 2023-11-07 DIAGNOSIS — S069X1A Unspecified intracranial injury with loss of consciousness of 30 minutes or less, initial encounter: Secondary | ICD-10-CM | POA: Diagnosis present

## 2023-11-07 DIAGNOSIS — Y9389 Activity, other specified: Secondary | ICD-10-CM | POA: Diagnosis not present

## 2023-11-07 LAB — CBC
HCT: 45.7 % (ref 39.0–52.0)
Hemoglobin: 15.3 g/dL (ref 13.0–17.0)
MCH: 31 pg (ref 26.0–34.0)
MCHC: 33.5 g/dL (ref 30.0–36.0)
MCV: 92.5 fL (ref 80.0–100.0)
Platelets: 150 10*3/uL (ref 150–400)
RBC: 4.94 MIL/uL (ref 4.22–5.81)
RDW: 14.7 % (ref 11.5–15.5)
WBC: 7.5 10*3/uL (ref 4.0–10.5)
nRBC: 0 % (ref 0.0–0.2)

## 2023-11-07 LAB — BASIC METABOLIC PANEL
Anion gap: 9 (ref 5–15)
BUN: 18 mg/dL (ref 8–23)
CO2: 25 mmol/L (ref 22–32)
Calcium: 9.3 mg/dL (ref 8.9–10.3)
Chloride: 106 mmol/L (ref 98–111)
Creatinine, Ser: 1 mg/dL (ref 0.61–1.24)
GFR, Estimated: >60 mL/min (ref >60)
Glucose, Bld: 169 mg/dL — ABNORMAL HIGH (ref 70–99)
Potassium: 3.7 mmol/L (ref 3.5–5.1)
Sodium: 140 mmol/L (ref 135–145)

## 2023-11-07 LAB — TROPONIN I (HIGH SENSITIVITY): Troponin I (High Sensitivity): 5 ng/L (ref <18)

## 2023-11-07 MED ORDER — ACETAMINOPHEN 500 MG PO TABS
1000.0000 mg | ORAL_TABLET | Freq: Once | ORAL | Status: AC
  Administered 2023-11-07: 1000 mg via ORAL
  Filled 2023-11-07: qty 2

## 2023-11-07 MED ORDER — TRAMADOL HCL 50 MG PO TABS: 50.0000 mg | ORAL_TABLET | Freq: Four times a day (QID) | ORAL | 0 refills | Status: AC | PRN

## 2023-11-07 NOTE — ED Triage Notes (Signed)
 Pt to ED with wife after accident while chainsawing and tree branch fell onto R side of body: chest, head, hip area. Knocked him off his feet and felt dizzy and passed out (after hit). Pt is alert, oriented. Just sore to R chest. EMS evaluated pt on scene. Hx concussion. No obvious injuries.

## 2023-11-07 NOTE — ED Provider Triage Note (Signed)
 Emergency Medicine Provider Triage Evaluation Note  Christopher Graves , a 68 y.o. male  was evaluated in triage.  Pt complains of head injury, right rib pain after a tree hit him in the head.  Review of Systems  Positive:  Negative:   Physical Exam  BP (!) 164/96 (BP Location: Left Arm)   Pulse 64   Temp 98.1 F (36.7 C) (Oral)   Resp 16   Ht 5\' 11"  (1.803 m)   Wt 81.6 kg   SpO2 100%   BMI 25.10 kg/m  Gen:   Awake, no distress   Resp:  Normal effort  MSK:   Moves extremities without difficulty  Other:    Medical Decision Making  Medically screening exam initiated at 11:17 AM.  Appropriate orders placed.  Christopher Graves was informed that the remainder of the evaluation will be completed by another provider, this initial triage assessment does not replace that evaluation, and the importance of remaining in the ED until their evaluation is complete.     Christopher Ghee, PA-C 11/07/23 1118

## 2023-11-07 NOTE — ED Provider Notes (Signed)
 [facility] Provider Note    Event Date/Time   First MD Initiated Contact with Patient 11/07/23 1603     (approximate)   History   tree branch fell on pt and Loss of Consciousness   HPI  Christopher Graves is a 68 y.o. male who presents to the ER for eval ration of a trauma chest trauma after the patient was struck by a loaded tree limb while he was cutting it.  States he was knocked back.  Larey Seat to the ground.  Had episode of near syncope or possible complete syncope is not completely certain.  Does report some mild chest pain currently.  This episode occurred around 6 hours earlier.     Physical Exam   Triage Vital Signs: ED Triage Vitals  Encounter Vitals Group     BP 11/07/23 1108 (!) 164/96     Systolic BP Percentile --      Diastolic BP Percentile --      Pulse Rate 11/07/23 1108 64     Resp 11/07/23 1108 16     Temp 11/07/23 1108 98.1 F (36.7 C)     Temp Source 11/07/23 1108 Oral     SpO2 11/07/23 1108 100 %     Weight 11/07/23 1108 180 lb (81.6 kg)     Height 11/07/23 1108 5\' 11"  (1.803 m)     Head Circumference --      Peak Flow --      Pain Score 11/07/23 1110 2     Pain Loc --      Pain Education --      Exclude from Growth Chart --     Most recent vital signs: Vitals:   11/07/23 1108  BP: (!) 164/96  Pulse: 64  Resp: 16  Temp: 98.1 F (36.7 C)  SpO2: 100%     Constitutional: Alert  Eyes: Conjunctivae are normal.  Head: Atraumatic. Nose: No congestion/rhinnorhea. Mouth/Throat: Mucous membranes are moist.   Neck: Painless ROM.  Cardiovascular:   Good peripheral circulation. Respiratory: Normal respiratory effort.  No retractions.  Gastrointestinal: Soft and nontender.  Musculoskeletal:  no deformity Neurologic:  MAE spontaneously. No gross focal neurologic deficits are appreciated.  Skin:  Skin is warm, dry and intact. No rash noted. Psychiatric: Mood and affect are normal. Speech and behavior are normal.    ED  Results / Procedures / Treatments   Labs (all labs ordered are listed, but only abnormal results are displayed) Labs Reviewed  BASIC METABOLIC PANEL - Abnormal; Notable for the following components:      Result Value   Glucose, Bld 169 (*)    All other components within normal limits  CBC  TROPONIN I (HIGH SENSITIVITY)     EKG  ED ECG REPORT I, Willy Eddy, the attending physician, personally viewed and interpreted this ECG.   Date: 11/07/2023  EKG Time: 11:17  Rate: 65  Rhythm: sinus  Axis: normal  Intervals: normal  ST&T Change: no stemi, no depressions    RADIOLOGY Please see ED Course for my review and interpretation.  I personally reviewed all radiographic images ordered to evaluate for the above acute complaints and reviewed radiology reports and findings.  These findings were personally discussed with the patient.  Please see medical record for radiology report.    PROCEDURES:  Critical Care performed: No  Procedures   MEDICATIONS ORDERED IN ED: Medications  acetaminophen (TYLENOL) tablet 1,000 mg (1,000 mg Oral Given 11/07/23 1642)  IMPRESSION / MDM / ASSESSMENT AND PLAN / ED COURSE  I reviewed the triage vital signs and the nursing notes.                              Differential diagnosis includes, but is not limited to, sah, sdh, edh, fracture, contusion, soft tissue injury, viscous injury, concussion, hemorrhage  Patient presenting to the ER for evaluation of symptoms as described above.  Based on symptoms, risk factors and considered above differential, this presenting complaint could reflect a potentially life-threatening illness therefore the patient will be placed on continuous pulse oximetry and telemetry for monitoring.  Laboratory evaluation will be sent to evaluate for the above complaints.      Clinical Course as of 11/07/23 1654  Tue Nov 07, 2023  1648 X-ray pelvis on my review and interpretation does not show any evidence of  displaced fracture.  Patient is able to bear weight.  Awaiting troponin but if negative anticipate will be appropriate for outpatient follow-up. [PR]    Clinical Course User Index [PR] Willy Eddy, MD     FINAL CLINICAL IMPRESSION(S) / ED DIAGNOSES   Final diagnoses:  Accidentally struck by tree, initial encounter  Chest wall pain     Rx / DC Orders   ED Discharge Orders          Ordered    traMADol (ULTRAM) 50 MG tablet  Every 6 hours PRN        11/07/23 1653             Note:  This document was prepared using Dragon voice recognition software and may include unintentional dictation errors.    Willy Eddy, MD 11/07/23 [phone]

## 2024-11-13 ENCOUNTER — Ambulatory Visit: Admitting: Family Medicine
